# Patient Record
Sex: Male | Born: 1960 | Race: White | Hispanic: No | Smoking: Current every day smoker
Health system: Southern US, Community
[De-identification: ages and names within clinical notes are randomized; demographics above are authoritative.]

## PROBLEM LIST (undated history)

## (undated) DIAGNOSIS — F1020 Alcohol dependence, uncomplicated: Secondary | ICD-10-CM

## (undated) DIAGNOSIS — G43909 Migraine, unspecified, not intractable, without status migrainosus: Secondary | ICD-10-CM

## (undated) DIAGNOSIS — F32A Depression, unspecified: Secondary | ICD-10-CM

## (undated) DIAGNOSIS — F329 Major depressive disorder, single episode, unspecified: Secondary | ICD-10-CM

## (undated) DIAGNOSIS — M549 Dorsalgia, unspecified: Secondary | ICD-10-CM

## (undated) DIAGNOSIS — F1910 Other psychoactive substance abuse, uncomplicated: Secondary | ICD-10-CM

## (undated) DIAGNOSIS — F419 Anxiety disorder, unspecified: Secondary | ICD-10-CM

## (undated) DIAGNOSIS — G8929 Other chronic pain: Secondary | ICD-10-CM

## (undated) DIAGNOSIS — M1990 Unspecified osteoarthritis, unspecified site: Secondary | ICD-10-CM

## (undated) HISTORY — PX: NASAL SEPTUM SURGERY: SHX37

## (undated) HISTORY — PX: FRACTURE SURGERY: SHX138

## (undated) HISTORY — DX: Major depressive disorder, single episode, unspecified: F32.9

## (undated) HISTORY — DX: Alcohol dependence, uncomplicated: F10.20

## (undated) HISTORY — PX: FEMUR FRACTURE SURGERY: SHX633

## (undated) HISTORY — DX: Other psychoactive substance abuse, uncomplicated: F19.10

## (undated) HISTORY — DX: Anxiety disorder, unspecified: F41.9

## (undated) HISTORY — DX: Depression, unspecified: F32.A

---

## 2003-01-05 ENCOUNTER — Emergency Department (HOSPITAL_COMMUNITY): Admission: EM | Admit: 2003-01-05 | Discharge: 2003-01-05 | Payer: Self-pay | Admitting: Emergency Medicine

## 2003-11-21 ENCOUNTER — Emergency Department (HOSPITAL_COMMUNITY): Admission: EM | Admit: 2003-11-21 | Discharge: 2003-11-21 | Payer: Self-pay | Admitting: Emergency Medicine

## 2004-05-15 ENCOUNTER — Emergency Department (HOSPITAL_COMMUNITY): Admission: EM | Admit: 2004-05-15 | Discharge: 2004-05-15 | Payer: Self-pay | Admitting: Emergency Medicine

## 2004-05-20 ENCOUNTER — Emergency Department (HOSPITAL_COMMUNITY): Admission: EM | Admit: 2004-05-20 | Discharge: 2004-05-20 | Payer: Self-pay | Admitting: Emergency Medicine

## 2004-07-16 ENCOUNTER — Emergency Department (HOSPITAL_COMMUNITY): Admission: EM | Admit: 2004-07-16 | Discharge: 2004-07-16 | Payer: Self-pay | Admitting: Family Medicine

## 2004-12-10 ENCOUNTER — Emergency Department (HOSPITAL_COMMUNITY): Admission: EM | Admit: 2004-12-10 | Discharge: 2004-12-10 | Payer: Self-pay | Admitting: Emergency Medicine

## 2005-02-17 ENCOUNTER — Emergency Department (HOSPITAL_COMMUNITY): Admission: EM | Admit: 2005-02-17 | Discharge: 2005-02-17 | Payer: Self-pay | Admitting: Emergency Medicine

## 2005-12-26 ENCOUNTER — Emergency Department (HOSPITAL_COMMUNITY): Admission: EM | Admit: 2005-12-26 | Discharge: 2005-12-26 | Payer: Self-pay | Admitting: Certified Registered"

## 2006-01-02 ENCOUNTER — Emergency Department (HOSPITAL_COMMUNITY): Admission: EM | Admit: 2006-01-02 | Discharge: 2006-01-02 | Payer: Self-pay | Admitting: Emergency Medicine

## 2008-02-29 ENCOUNTER — Emergency Department (HOSPITAL_COMMUNITY): Admission: EM | Admit: 2008-02-29 | Discharge: 2008-02-29 | Payer: Self-pay | Admitting: Emergency Medicine

## 2008-02-29 ENCOUNTER — Encounter (INDEPENDENT_AMBULATORY_CARE_PROVIDER_SITE_OTHER): Payer: Self-pay | Admitting: Family Medicine

## 2008-04-24 ENCOUNTER — Emergency Department (HOSPITAL_COMMUNITY): Admission: EM | Admit: 2008-04-24 | Discharge: 2008-04-24 | Payer: Self-pay | Admitting: Emergency Medicine

## 2008-05-11 ENCOUNTER — Emergency Department (HOSPITAL_COMMUNITY): Admission: EM | Admit: 2008-05-11 | Discharge: 2008-05-11 | Payer: Self-pay | Admitting: Emergency Medicine

## 2008-06-23 ENCOUNTER — Ambulatory Visit: Payer: Self-pay | Admitting: Family Medicine

## 2008-06-23 ENCOUNTER — Telehealth (INDEPENDENT_AMBULATORY_CARE_PROVIDER_SITE_OTHER): Payer: Self-pay | Admitting: Family Medicine

## 2008-06-30 ENCOUNTER — Encounter (INDEPENDENT_AMBULATORY_CARE_PROVIDER_SITE_OTHER): Payer: Self-pay | Admitting: Family Medicine

## 2008-07-05 DIAGNOSIS — M549 Dorsalgia, unspecified: Secondary | ICD-10-CM | POA: Insufficient documentation

## 2008-07-10 ENCOUNTER — Telehealth (INDEPENDENT_AMBULATORY_CARE_PROVIDER_SITE_OTHER): Payer: Self-pay | Admitting: *Deleted

## 2008-07-14 ENCOUNTER — Encounter (INDEPENDENT_AMBULATORY_CARE_PROVIDER_SITE_OTHER): Payer: Self-pay | Admitting: Family Medicine

## 2008-07-20 ENCOUNTER — Ambulatory Visit: Payer: Self-pay | Admitting: Family Medicine

## 2008-07-20 LAB — CONVERTED CEMR LAB
ALT: 30 units/L (ref 0–53)
AST: 24 units/L (ref 0–37)
Alkaline Phosphatase: 72 units/L (ref 39–117)
Basophils Relative: 1 % (ref 0–1)
CO2: 24 meq/L (ref 19–32)
Calcium: 9.6 mg/dL (ref 8.4–10.5)
Chloride: 107 meq/L (ref 96–112)
Creatinine, Ser: 0.87 mg/dL (ref 0.40–1.50)
Eosinophils Absolute: 0.1 10*3/uL (ref 0.0–0.7)
Eosinophils Relative: 1 % (ref 0–5)
HDL: 48 mg/dL (ref >39)
Lymphs Abs: 1.9 10*3/uL (ref 0.7–4.0)
Neutro Abs: 5.3 10*3/uL (ref 1.7–7.7)
Platelets: 326 10*3/uL (ref 150–400)
Potassium: 4.7 meq/L (ref 3.5–5.3)
Sodium: 144 meq/L (ref 135–145)
TSH: 0.847 microintl units/mL (ref 0.350–4.50)
Total Protein: 7.3 g/dL (ref 6.0–8.3)
VLDL: 24 mg/dL (ref 0–40)

## 2008-07-21 ENCOUNTER — Encounter (INDEPENDENT_AMBULATORY_CARE_PROVIDER_SITE_OTHER): Payer: Self-pay | Admitting: Family Medicine

## 2008-07-22 ENCOUNTER — Telehealth (INDEPENDENT_AMBULATORY_CARE_PROVIDER_SITE_OTHER): Payer: Self-pay | Admitting: *Deleted

## 2008-07-30 ENCOUNTER — Encounter (INDEPENDENT_AMBULATORY_CARE_PROVIDER_SITE_OTHER): Payer: Self-pay | Admitting: Family Medicine

## 2008-08-11 ENCOUNTER — Encounter: Admission: RE | Admit: 2008-08-11 | Discharge: 2008-08-11 | Payer: Self-pay | Admitting: Family Medicine

## 2008-08-18 ENCOUNTER — Encounter (INDEPENDENT_AMBULATORY_CARE_PROVIDER_SITE_OTHER): Payer: Self-pay | Admitting: Family Medicine

## 2008-08-21 ENCOUNTER — Telehealth (INDEPENDENT_AMBULATORY_CARE_PROVIDER_SITE_OTHER): Payer: Self-pay | Admitting: Family Medicine

## 2008-08-24 ENCOUNTER — Telehealth (INDEPENDENT_AMBULATORY_CARE_PROVIDER_SITE_OTHER): Payer: Self-pay | Admitting: *Deleted

## 2008-08-31 ENCOUNTER — Telehealth (INDEPENDENT_AMBULATORY_CARE_PROVIDER_SITE_OTHER): Payer: Self-pay | Admitting: *Deleted

## 2008-09-07 ENCOUNTER — Telehealth (INDEPENDENT_AMBULATORY_CARE_PROVIDER_SITE_OTHER): Payer: Self-pay | Admitting: *Deleted

## 2008-09-07 ENCOUNTER — Emergency Department (HOSPITAL_COMMUNITY): Admission: EM | Admit: 2008-09-07 | Discharge: 2008-09-07 | Payer: Self-pay | Admitting: Emergency Medicine

## 2008-09-08 ENCOUNTER — Ambulatory Visit: Payer: Self-pay | Admitting: Internal Medicine

## 2008-09-09 ENCOUNTER — Ambulatory Visit: Payer: Self-pay | Admitting: *Deleted

## 2008-09-15 ENCOUNTER — Encounter (INDEPENDENT_AMBULATORY_CARE_PROVIDER_SITE_OTHER): Payer: Self-pay | Admitting: Internal Medicine

## 2009-09-02 ENCOUNTER — Ambulatory Visit: Payer: Self-pay | Admitting: Internal Medicine

## 2009-09-02 DIAGNOSIS — M5430 Sciatica, unspecified side: Secondary | ICD-10-CM

## 2009-09-07 ENCOUNTER — Encounter (INDEPENDENT_AMBULATORY_CARE_PROVIDER_SITE_OTHER): Payer: Self-pay | Admitting: Internal Medicine

## 2010-05-01 ENCOUNTER — Emergency Department (HOSPITAL_COMMUNITY)
Admission: EM | Admit: 2010-05-01 | Discharge: 2010-05-01 | Payer: Self-pay | Source: Home / Self Care | Admitting: Emergency Medicine

## 2010-07-12 NOTE — Letter (Signed)
Summary: REQUESTING RECORDS FROM [facility]  REQUESTING RECORDS FROM [facility]   Imported By: Arta Bruce 09/21/2009 15:57:58  _____________________________________________________________________  External Attachment:    Type:   Image     Comment:   External Document

## 2010-07-12 NOTE — Assessment & Plan Note (Signed)
Summary: ref to neurology//kt   Vital Signs:  Patient profile:   50 year old male Weight:      155.25 pounds BMI:     25.15 Temp:     98.2 degrees F Pulse rate:   65 / minute Pulse rhythm:   regular Resp:     16 per minute BP sitting:   133 / 86  (left arm) Cuff size:   regular  Vitals Entered By: Chauncy Passy, SMA  CC: Pt. is here b/c his both hand and right foot start to go numb. He was told at [facility] hosp. ER that his spinal cord is being constricted. Pt. would like to be referred out to a neuro. Pt. is taking goody powder for the pain. Any pysical activity makes the pain worse. Is Patient Diabetic? No Pain Assessment Patient in pain? yes     Location: neck/back Intensity: 7 Type: dull Onset of pain  Constant  Does patient need assistance? Functional Status Self care Ambulation Normal   CC:  Pt. is here b/c his both hand and right foot start to go numb. He was told at [facility] hosp. ER that his spinal cord is being constricted. Pt. would like to be referred out to a neuro. Pt. is taking goody powder for the pain. Any pysical activity makes the pain worse.Marland Kitchen  History of Present Illness: 1.  Chronic back and neck pain:  Going to [facility]--apparently prescribing Lidoderm patches and Percocet.  Stopped all pain meds as unemployment ran out.  Last seen 2 months ago.  Was getting Lidoderm patches through [facility]--later states was Fentanyl patches.  2. Having numbness in hands for past 4 months.  Can just bend his neck and they go completely numb.  Does not really awaken with hand asleep when in bed.  Started out with just ulnar hands going numb, now the entire hand goes numb.  Pt. 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  3.  Right foot going numb with sitting.  Also in past 4 months.  Whole foot goes numb when sitting.  Resolves when gets up and moves  around.    Current Medications (verified): 1)  Celebrex 100 Mg Caps (Celecoxib) .... Take 1 Capsule By Mouth Every 12 Hours As Needed For Neck and Back Pain 2)  Carisoprodol 350 Mg Tabs (Carisoprodol) .Marland Kitchen.. 1 Tab By Mouth Q 8 Hours As Needed Muscle Spasm 3)  Neurontin 300 Mg Caps (Gabapentin) .Marland Kitchen.. 1 Cap By Mouth Q Hs.  Increase By 1 Cap Every 3 Days Until Taking 2 Caps By Mouth Three Times A Day  Allergies (verified): No Known Drug Allergies  Physical Exam  Msk:  NT over all spinous processes.   Extremities:  Full ROM Neurologic:  alert & oriented X3, cranial nerves II-XII intact, strength normal in all extremities, sensation intact to light touch, and DTRs symmetrical and normal.   Possible positive Tinel's over bilateral median nerve at volar wrists.  Negative Phalen's   Impression & Recommendations:  Problem # 1:  CERVICAL RADICULOPATHY, BILATERAL (ICD-723.4)  Orders: Physical Therapy Referral (PT)  Problem # 2:  SCIATICA, RIGHT (ICD-724.3)  The following medications were removed from the medication list:    Celebrex 100 Mg Caps (Celecoxib) .Marland Kitchen... Take 1 capsule by mouth every 12 hours as needed for neck and back pain  Carisoprodol 350 Mg Tabs (Carisoprodol) .Marland Kitchen... 1 tab by mouth q 8 hours as needed muscle spasm  Orders: Physical Therapy Referral (PT)  Problem # 3:  BACK PAIN, CHRONIC (ICD-724.5) Discussed no pain meds until get pain clinic records--reportedly receiving Fentanyl patches and will not fill those, regardless. The following medications were removed from the medication list:    Celebrex 100 Mg Caps (Celecoxib) .Marland Kitchen... Take 1 capsule by mouth every 12 hours as needed for neck and back pain    Carisoprodol 350 Mg Tabs (Carisoprodol) .Marland Kitchen... 1 tab by mouth q 8 hours as needed muscle spasm  Patient Instructions: 1)  Release of INformation--[facility]. 2)  Follow up with Dr. Delrae Alfred in 3 months --hand numbness and foot numbness.

## 2010-08-02 ENCOUNTER — Emergency Department (HOSPITAL_COMMUNITY)
Admission: EM | Admit: 2010-08-02 | Discharge: 2010-08-02 | Disposition: A | Discharge status: Home / Self Care | Payer: Medicaid Other | Attending: Emergency Medicine | Admitting: Emergency Medicine

## 2010-08-02 DIAGNOSIS — R197 Diarrhea, unspecified: Secondary | ICD-10-CM | POA: Insufficient documentation

## 2010-08-02 DIAGNOSIS — G8929 Other chronic pain: Secondary | ICD-10-CM | POA: Insufficient documentation

## 2010-08-02 DIAGNOSIS — M542 Cervicalgia: Secondary | ICD-10-CM | POA: Insufficient documentation

## 2010-08-02 DIAGNOSIS — M549 Dorsalgia, unspecified: Secondary | ICD-10-CM | POA: Insufficient documentation
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
## 2011-03-13 LAB — URINE MICROSCOPIC-ADD ON

## 2011-03-13 LAB — URINALYSIS, ROUTINE W REFLEX MICROSCOPIC
Protein, ur: NEGATIVE
Specific Gravity, Urine: 1.018

## 2011-09-29 ENCOUNTER — Encounter (HOSPITAL_COMMUNITY): Payer: Self-pay | Admitting: Physical Medicine and Rehabilitation

## 2011-09-29 ENCOUNTER — Emergency Department (HOSPITAL_COMMUNITY)
Admission: EM | Admit: 2011-09-29 | Discharge: 2011-09-29 | Disposition: A | Discharge status: Home Health | Payer: Medicaid Other | Attending: Emergency Medicine | Admitting: Emergency Medicine

## 2011-09-29 DIAGNOSIS — F17200 Nicotine dependence, unspecified, uncomplicated: Secondary | ICD-10-CM | POA: Insufficient documentation

## 2011-09-29 DIAGNOSIS — G8929 Other chronic pain: Secondary | ICD-10-CM | POA: Insufficient documentation

## 2011-09-29 DIAGNOSIS — M549 Dorsalgia, unspecified: Secondary | ICD-10-CM

## 2011-09-29 HISTORY — DX: Other chronic pain: G89.29

## 2011-09-29 MED ORDER — FENTANYL 50 MCG/HR TD PT72: 1.0000 | MEDICATED_PATCH | TRANSDERMAL | Status: DC

## 2011-09-29 MED ORDER — OXYMORPHONE HCL ER 10 MG PO T12A: 10.0000 mg | EXTENDED_RELEASE_TABLET | Freq: Two times a day (BID) | ORAL | Status: DC

## 2011-09-29 NOTE — ED Notes (Signed)
Pt presents to department for evaluation of medication refill. Pt states he is no longer a patient at pain clinic, he was referred to family practice. C/o chronic neck and back pain. States he is out of fentanyl patches and oxymorphone. Requesting medication refill until next appointment. He is alert and oriented x4. No signs of acute distress noted.

## 2011-09-29 NOTE — ED Provider Notes (Signed)
Medical screening examination/treatment/procedure(s) were performed by non-physician practitioner and as supervising physician I was immediately available for consultation/collaboration.  Colby Reels R. Sandip Power, MD 09/29/11 1543 

## 2011-09-29 NOTE — Discharge Instructions (Signed)
It is VERY important for you to contact either [facility] or [facility] to further discuss your pain management because the emergency department will not refill chronic pain medications on a regular basis and you will run out of your pain medications before your scheduled appointment on May 8th. You will need to plan ahead and not wait until the day the medication runs out. Return to ER for emergent changing or worsening of symptoms.   Chronic Back Pain When back pain lasts longer than 3 months, it is called chronic back pain.This pain can be frustrating, but the cause of the pain is rarely dangerous.People with chronic back pain often go through certain periods that are more intense (flare-ups). CAUSES Chronic back pain can be caused by wear and tear (degeneration) on different structures in your back. These structures may include bones, ligaments, or discs. This degeneration may result in more pressure being placed on the nerves that travel to your legs and feet. This can lead to pain traveling from the low back down the back of the legs. When pain lasts longer than 3 months, it is not unusual for people to experience anxiety or depression. Anxiety and depression can also contribute to low back pain. TREATMENT  Establish a regular exercise plan. This is critical to improving your functional level.   Have a self-management plan for when you flare-up. Flare-ups rarely require a medical visit. Regular exercise will help reduce the intensity and frequency of your flare-ups.   Manage how you feel about your back pain and the rest of your life. Anxiety, depression, and feeling that you cannot alter your back pain have been shown to make back pain more intense and debilitating.   Medicines should never be your only treatment. They should be used along with other treatments to help you return to a more active lifestyle.   Procedures such as injections or surgery may be helpful but  are rarely necessary. You may be able to get the same results with physical therapy or chiropractic care.  HOME CARE INSTRUCTIONS  Avoid bending, heavy lifting, prolonged sitting, and activities which make the problem worse.   Continue normal activity as much as possible.   Take brief periods of rest throughout the day to reduce your pain during flare-ups.   Follow your back exercise rehabilitation program. This can help reduce symptoms and prevent more pain.   Only take over-the-counter or prescription medicines as directed by your caregiver. Muscle relaxants are sometimes prescribed. Narcotic pain medicine is discouraged for long-term pain, since addiction is a possible outcome.   If you smoke, quit.   Eat healthy foods and maintain a recommended body weight.  SEEK IMMEDIATE MEDICAL CARE IF:   You have weakness or numbness in one of your legs or feet.   You have trouble controlling your bladder or bowels.   You develop nausea, vomiting, abdominal pain, shortness of breath, or fainting.  Document Released: 07/06/2004 Document Revised: 05/18/2011 Document Reviewed: 05/13/2011 [facility] Patient Information 2012 [place], [place].

## 2011-09-29 NOTE — ED Provider Notes (Signed)
History     CSN: [id number]  Arrival date & time 09/29/11  7829   First MD Initiated Contact with Patient 09/29/11 1016      Chief Complaint  Patient presents with  . Medication Refill    (Consider location/radiation/quality/duration/timing/severity/associated sxs/prior treatment) HPI  Patient with long standing hx of chronic pain, chronic back pain who has been managed by [facility] for a long time presents to ER stating that he was at his one month visit yesterday to have oxymorphone and fentanyl refilled but that the provider refused to refill until they "could figure out whether or not he had gotten oxycodone from another source." patient denies taking narcotics from additional source but states he has been unhappy with [facility] [place] for a while and has a schedule appointment with [facility] on May the 8th to continue pain management and for general health care but presents to ER requesting pain medication refill. Patient has no other complaints. States daily chronic pain that is unchanging but increases without pain medication use. Denies new injury. Denies aggravating or alleviating factors. Past Medical History  Diagnosis Date  . Chronic pain     History reviewed. No pertinent past surgical history.  History reviewed. No pertinent family history.  History  Substance Use Topics  . Smoking status: Current Everyday Smoker    Types: Cigarettes  . Smokeless tobacco: Not on file  . Alcohol Use: No      Review of Systems  Constitutional: Negative for fever and chills.  HENT: Positive for neck pain.   Respiratory: Negative for chest tightness.   Cardiovascular: Negative for chest pain.  Musculoskeletal: Positive for back pain. Negative for joint swelling.  Skin: Negative for rash.  Neurological: Positive for weakness. Negative for dizziness.    Allergies  Review of patient's allergies indicates no known allergies.  Home Medications   Current Outpatient  Rx  Name Route Sig Dispense Refill  . GOODY HEADACHE PO Oral Take 1 Package by mouth 2 (two) times daily as needed. For headache    . FENTANYL 50 MCG/HR TD PT72 Transdermal Place 1 patch onto the skin every 3 (three) days.    . IBUPROFEN 200 MG PO TABS Oral Take 600 mg by mouth every 8 (eight) hours as needed. For pain.    Marland Kitchen OXYMORPHONE HCL ER 10 MG PO TB12 Oral Take 10 mg by mouth 4 (four) times daily.    . FENTANYL 50 MCG/HR TD PT72 Transdermal Place 1 patch (50 mcg total) onto the skin every 3 (three) days. 5 patch 0  . OXYMORPHONE ER (CRUSH RESIST) 10 MG PO TB12 Oral Take 1 tablet (10 mg total) by mouth every 12 (twelve) hours. 10 tablet 0    BP 124/81  Pulse 79  Temp(Src) 97.7 F (36.5 C) (Oral)  Resp 18  SpO2 99%  Physical Exam  Nursing note and vitals reviewed. Constitutional: He is oriented to person, place, and time. He appears well-developed and well-nourished. No distress.  HENT:  Head: Normocephalic and atraumatic.  Eyes: Conjunctivae are normal.  Neck: Normal range of motion. Neck supple.  Cardiovascular: Normal rate, regular rhythm, normal heart sounds and intact distal pulses.  Exam reveals no gallop and no friction rub.   No murmur heard. Pulmonary/Chest: Effort normal and breath sounds normal. No respiratory distress. He has no wheezes. He has no rales. He exhibits no tenderness.  Abdominal: Soft. Bowel sounds are normal. He exhibits no distension and no mass. There is no tenderness.  There is no rebound and no guarding.  Musculoskeletal: Normal range of motion. He exhibits tenderness. He exhibits no edema.       5/5 strength of UE and LE bilaterally. Normal reflexes. TTP of entire midline spine but FROM of neck. No skin changes. Ambulating without difficulty.   Neurological: He is alert and oriented to person, place, and time. He has normal reflexes.  Skin: Skin is warm and dry. No rash noted. He is not diaphoretic. No erythema.  Psychiatric: He has a normal mood and  affect.    ED Course  Procedures (including critical care time)  Labs Reviewed - No data to display No results found.   1. BACK PAIN, CHRONIC       MDM  I reviewed narcotic database to see a long standing hx of pain medication prescription by one provider at [facility] on a monthly basis with last prescriptions given a month ago with none recorded since. I spoke at length with patient about the inability and inappropriateness of ER to fill chronic pain meds and that could not given him his normal month supply but that he would be prescribed a few days to allow him to further discuss pain management with [facility] or get an earlier appointment with [facility].   Patient has no new complaints other than daily chronic pain. No new injury or red flags noted for back pain complaints.         Jenness Corner, [place] 09/29/11 1056

## 2011-10-06 ENCOUNTER — Emergency Department (HOSPITAL_COMMUNITY)
Admission: EM | Admit: 2011-10-06 | Discharge: 2011-10-06 | Disposition: A | Discharge status: Home / Self Care | Payer: Medicaid Other | Attending: Emergency Medicine | Admitting: Emergency Medicine

## 2011-10-06 ENCOUNTER — Encounter (HOSPITAL_COMMUNITY): Payer: Self-pay | Admitting: Emergency Medicine

## 2011-10-06 DIAGNOSIS — M549 Dorsalgia, unspecified: Secondary | ICD-10-CM | POA: Insufficient documentation

## 2011-10-06 DIAGNOSIS — F17200 Nicotine dependence, unspecified, uncomplicated: Secondary | ICD-10-CM | POA: Insufficient documentation

## 2011-10-06 DIAGNOSIS — G8929 Other chronic pain: Secondary | ICD-10-CM | POA: Insufficient documentation

## 2011-10-06 MED ORDER — OXYCODONE-ACETAMINOPHEN 5-325 MG PO TABS
2.0000 | ORAL_TABLET | Freq: Once | ORAL | Status: DC
  Filled 2011-10-06: qty 2

## 2011-10-06 NOTE — ED Notes (Signed)
Pt c/o of chronic pain in back and neck. Pt was being seen at a pain clinic but quit going.

## 2011-10-06 NOTE — ED Notes (Signed)
Pt encouraged to make appointment with pain clinic.

## 2011-10-06 NOTE — ED Provider Notes (Signed)
Medical screening examination/treatment/procedure(s) were performed by non-physician practitioner and as supervising physician I was immediately available for consultation/collaboration.   Caison Hearn M Ibraham Levi, DO 10/06/11 2218 

## 2011-10-06 NOTE — Discharge Instructions (Signed)
It is EXTREMELY important to get in with your pain management clinic and or your primary care provider for further evaluation and management of her chronic back pain. Chronic pain management must be taken care of with this view providers as possible using the Emergency Department for emergent changing or worsening of symptoms.  Chronic Back Pain When back pain lasts longer than 3 months, it is called chronic back pain.This pain can be frustrating, but the cause of the pain is rarely dangerous.People with chronic back pain often go through certain periods that are more intense (flare-ups). CAUSES Chronic back pain can be caused by wear and tear (degeneration) on different structures in your back. These structures may include bones, ligaments, or discs. This degeneration may result in more pressure being placed on the nerves that travel to your legs and feet. This can lead to pain traveling from the low back down the back of the legs. When pain lasts longer than 3 months, it is not unusual for people to experience anxiety or depression. Anxiety and depression can also contribute to low back pain. TREATMENT  Establish a regular exercise plan. This is critical to improving your functional level.   Have a self-management plan for when you flare-up. Flare-ups rarely require a medical visit. Regular exercise will help reduce the intensity and frequency of your flare-ups.   Manage how you feel about your back pain and the rest of your life. Anxiety, depression, and feeling that you cannot alter your back pain have been shown to make back pain more intense and debilitating.   Medicines should never be your only treatment. They should be used along with other treatments to help you return to a more active lifestyle.   Procedures such as injections or surgery may be helpful but are rarely necessary. You may be able to get the same results with physical therapy or chiropractic care.  HOME CARE  INSTRUCTIONS  Avoid bending, heavy lifting, prolonged sitting, and activities which make the problem worse.   Continue normal activity as much as possible.   Take brief periods of rest throughout the day to reduce your pain during flare-ups.   Follow your back exercise rehabilitation program. This can help reduce symptoms and prevent more pain.   Only take over-the-counter or prescription medicines as directed by your caregiver. Muscle relaxants are sometimes prescribed. Narcotic pain medicine is discouraged for long-term pain, since addiction is a possible outcome.   If you smoke, quit.   Eat healthy foods and maintain a recommended body weight.  SEEK IMMEDIATE MEDICAL CARE IF:   You have weakness or numbness in one of your legs or feet.   You have trouble controlling your bladder or bowels.   You develop nausea, vomiting, abdominal pain, shortness of breath, or fainting.  Document Released: 07/06/2004 Document Revised: 05/18/2011 Document Reviewed: 05/13/2011 [facility] Patient Information 2012 [place], [place].

## 2011-10-06 NOTE — ED Provider Notes (Signed)
History     CSN: [id number]  Arrival date & time 10/06/11  1010   First MD Initiated Contact with Patient 10/06/11 1015      No chief complaint on file.   (Consider location/radiation/quality/duration/timing/severity/associated sxs/prior treatment) HPI  Patient presents to emergency department with complaint of chronic lower back pain with request of pain medication refill. I saw this patient in the [facility] Emergency Department last week and spoke at length with patient about chronic pain management. At that time patient was hoping to followup with [facility] for further management of his chronic pain because he did not want to continue use with the [facility] Pain management clinic. Patient's plan last week was to closely follow up with [facility]. Last week he was given a short supply of his opana and fentanyl patches. However patient states he has not followed up with [facility] or [facility] since leaving ER last week. He presents today stating that he has run out of his pain medicine he would like a refill. Patient denies any new or changing pain. Patient states his lower back pain is consistent with a chronic pain he's had for many years. He specifically denies lower [place] he numbness/tingling/weakness, saddle seat paresthesias, or loss of bowel or bladder function. He denies aggravating or alleviating factors. Past Medical History  Diagnosis Date  . Chronic pain     History reviewed. No pertinent past surgical history.  No family history on file.  History  Substance Use Topics  . Smoking status: Current Everyday Smoker    Types: Cigarettes  . Smokeless tobacco: Not on file  . Alcohol Use: No      Review of Systems  All other systems reviewed and are negative.    Allergies  Review of patient's allergies indicates no known allergies.  Home Medications   Current Outpatient Rx  Name Route Sig Dispense Refill  . GOODY HEADACHE PO Oral Take 1 Package by mouth 2 (two) times  daily as needed. For headache    . FENTANYL 50 MCG/HR TD PT72 Transdermal Place 1 patch onto the skin every 3 (three) days.    . FENTANYL 50 MCG/HR TD PT72 Transdermal Place 1 patch (50 mcg total) onto the skin every 3 (three) days. 5 patch 0  . IBUPROFEN 200 MG PO TABS Oral Take 600 mg by mouth every 8 (eight) hours as needed. For pain.    Marland Kitchen OXYMORPHONE HCL ER 10 MG PO TB12 Oral Take 10 mg by mouth 4 (four) times daily.    Marland Kitchen OXYMORPHONE ER (CRUSH RESIST) 10 MG PO TB12 Oral Take 1 tablet (10 mg total) by mouth every 12 (twelve) hours. 10 tablet 0    BP 148/81  Pulse 95  Temp 98.3 F (36.8 C)  Resp 20  SpO2 98%  Physical Exam  Nursing note and vitals reviewed. Constitutional: He is oriented to person, place, and time. He appears well-developed and well-nourished. No distress.  HENT:  Head: Normocephalic and atraumatic.  Eyes: Conjunctivae are normal.  Neck: Normal range of motion. Neck supple.  Cardiovascular: Normal rate, regular rhythm, normal heart sounds and intact distal pulses.  Exam reveals no gallop and no friction rub.   No murmur heard. Pulmonary/Chest: Effort normal and breath sounds normal. No respiratory distress. He has no wheezes. He has no rales. He exhibits no tenderness.  Abdominal: Soft. Bowel sounds are normal. He exhibits no distension and no mass. There is no tenderness. There is no rebound and no guarding.  Musculoskeletal:  Normal range of motion. He exhibits tenderness. He exhibits no edema.       Tenderness to palpation of entire mid to lower back and paraspinal region. No crepitus or skin changes.  Neurological: He is alert and oriented to person, place, and time. He has normal reflexes.       Negative straight leg. DTRs equal bilaterally of upper and lower x-rays.  Skin: Skin is warm and dry. No rash noted. He is not diaphoretic. No erythema.  Psychiatric: He has a normal mood and affect.    ED Course  Procedures (including critical care time)  Labs  Reviewed - No data to display No results found.   1. BACK PAIN, CHRONIC       MDM  Patient with chronic back pain with no red flags to suggest acute underlying disorders. Ambulating without difficulty. Bilateral extremities are neurovascularly intact.        Jenness Corner, [place] 10/06/11 1034

## 2011-10-12 ENCOUNTER — Encounter (HOSPITAL_COMMUNITY): Payer: Self-pay | Admitting: Emergency Medicine

## 2011-10-12 ENCOUNTER — Emergency Department (HOSPITAL_COMMUNITY)
Admission: EM | Admit: 2011-10-12 | Discharge: 2011-10-12 | Disposition: A | Discharge status: Home / Self Care | Payer: Medicaid Other | Attending: Emergency Medicine | Admitting: Emergency Medicine

## 2011-10-12 DIAGNOSIS — R197 Diarrhea, unspecified: Secondary | ICD-10-CM | POA: Insufficient documentation

## 2011-10-12 DIAGNOSIS — R1110 Vomiting, unspecified: Secondary | ICD-10-CM | POA: Insufficient documentation

## 2011-10-12 DIAGNOSIS — R059 Cough, unspecified: Secondary | ICD-10-CM | POA: Insufficient documentation

## 2011-10-12 DIAGNOSIS — G8929 Other chronic pain: Secondary | ICD-10-CM | POA: Insufficient documentation

## 2011-10-12 DIAGNOSIS — R05 Cough: Secondary | ICD-10-CM | POA: Insufficient documentation

## 2011-10-12 DIAGNOSIS — M549 Dorsalgia, unspecified: Secondary | ICD-10-CM | POA: Insufficient documentation

## 2011-10-12 LAB — URINALYSIS, ROUTINE W REFLEX MICROSCOPIC
Bilirubin Urine: NEGATIVE
Ketones, ur: NEGATIVE mg/dL
Leukocytes, UA: NEGATIVE
Nitrite: NEGATIVE
Urobilinogen, UA: 0.2 mg/dL (ref 0.0–1.0)

## 2011-10-12 LAB — URINE MICROSCOPIC-ADD ON

## 2011-10-12 LAB — CBC
MCHC: 35.3 g/dL (ref 30.0–36.0)
Platelets: 331 10*3/uL (ref 150–400)
RBC: 5.16 MIL/uL (ref 4.22–5.81)
WBC: 13.7 10*3/uL — ABNORMAL HIGH (ref 4.0–10.5)

## 2011-10-12 LAB — COMPREHENSIVE METABOLIC PANEL
ALT: 14 U/L (ref 0–53)
Albumin: 4.1 g/dL (ref 3.5–5.2)
CO2: 24 mEq/L (ref 19–32)
Calcium: 9.1 mg/dL (ref 8.4–10.5)
Chloride: 105 mEq/L (ref 96–112)
GFR calc Af Amer: >90 mL/min (ref >90)
Glucose, Bld: 111 mg/dL — ABNORMAL HIGH (ref 70–99)
Potassium: 4 mEq/L (ref 3.5–5.1)
Sodium: 142 mEq/L (ref 135–145)
Total Bilirubin: 0.1 mg/dL — ABNORMAL LOW (ref 0.3–1.2)

## 2011-10-12 LAB — DIFFERENTIAL
Basophils Absolute: 0 10*3/uL (ref 0.0–0.1)
Basophils Relative: 0 % (ref 0–1)
Eosinophils Absolute: 0.1 10*3/uL (ref 0.0–0.7)
Eosinophils Relative: 1 % (ref 0–5)
Lymphocytes Relative: 17 % (ref 12–46)
Lymphs Abs: 2.3 10*3/uL (ref 0.7–4.0)
Monocytes Absolute: 0.6 10*3/uL (ref 0.1–1.0)

## 2011-10-12 LAB — RAPID URINE DRUG SCREEN, HOSP PERFORMED
Amphetamines: NOT DETECTED
Cocaine: POSITIVE — AB
Opiates: POSITIVE — AB

## 2011-10-12 MED ORDER — OXYMORPHONE HCL 10 MG PO TABS: 10.0000 mg | ORAL_TABLET | ORAL | Status: AC | PRN

## 2011-10-12 MED ORDER — FENTANYL CITRATE 0.05 MG/ML IJ SOLN
100.0000 ug | Freq: Once | INTRAMUSCULAR | Status: AC
  Administered 2011-10-12: 100 ug via INTRAMUSCULAR
  Filled 2011-10-12: qty 2

## 2011-10-12 NOTE — ED Notes (Signed)
Dinner tray ordered, reg nonsharp 

## 2011-10-12 NOTE — ED Notes (Signed)
Pt reports that he has been on fentanyl patches for chronic pain. States that he sees a pain clinic but has not recently been able to get back in recently. States that he needs to have some type of pain medication and feels like he may be having withdrawals because he needs pain medication. Pt calm and cooperative, denies any SI/HI at this time.

## 2011-10-12 NOTE — ED Notes (Signed)
Going thru w/drawal from fenty;e patches last used 4 days ago deies SI or HI

## 2011-10-12 NOTE — ED Provider Notes (Addendum)
History     CSN: [id number]  Arrival date & time 10/12/11  1119   First MD Initiated Contact with Patient 10/12/11 1237      Chief Complaint  Patient presents with  . Medical Clearance    (Consider location/radiation/quality/duration/timing/severity/associated sxs/prior treatment) HPI  51yoM h/o chronic back pain since with back pain. The patient states that he ran out of his fentanyl patches approximately 4 days ago. He feels like he is withdrawing from his narcotic medication because he also ran out of his oxygen more for him. He states he's had a couple episodes of diarrhea as well as 2 episodes of vomiting. He states his pain has been identical to his previous pain but worse after running out of his narcotic pain medications. He denies numbness, tingling, weakness of his extremities. He denies saddle anesthesia. There is no urinary retention or incontinence. He denies fevers, chills. No recent trauma. 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 Is not requesting help for his narcotic dependence rather he is requesting a narcotic prescription until "I can get into my doctor in one week". To the emergency department multiple times for medication refills. He's also been seen in the past and pain clinic and states that he will not return to that same pain clinic.  He states that he was seen several weeks ago for a cough and prescribed antibiotics. He states his cough is better and he has not had a fever since 3 weeks ago. No shortness of breath or chest pain.  Denies SI/HI/AVH   ED Notes, ED Provider Notes from 10/12/11 0000 to 10/12/11 11:31:05       Clare Charon, RN 10/12/2011 11:26      Going thru w/drawal from fenty;e patches last used 4 days ago deies SI or HI     Past Medical History  Diagnosis Date  . Chronic pain     History reviewed. No pertinent past surgical history.  No family history on file.  History  Substance Use Topics  . Smoking status: Current Everyday  Smoker    Types: Cigarettes  . Smokeless tobacco: Not on file  . Alcohol Use: No      Review of Systems  All other systems reviewed and are negative.   except as noted HPI   Allergies  Review of patient's allergies indicates no known allergies.  Home Medications   Current Outpatient Rx  Name Route Sig Dispense Refill  . GOODY HEADACHE PO Oral Take 1 Package by mouth 2 (two) times daily as needed. For headache    . FENTANYL 50 MCG/HR TD PT72 Transdermal Place 1 patch onto the skin every 3 (three) days.    Marland Kitchen OXYMORPHONE HCL ER 10 MG PO TB12 Oral Take 10 mg by mouth 4 (four) times daily.    Marland Kitchen OXYMORPHONE HCL 10 MG PO TABS Oral Take 1 tablet (10 mg total) by mouth every 4 (four) hours as needed for pain. 20 tablet 0    BP 132/96  Pulse 91  Temp(Src) 98.4 F (36.9 C) (Oral)  Resp 18  SpO2 97%  Physical Exam  Nursing note and vitals reviewed. Constitutional: He is oriented to person, place, and time. He appears well-developed and well-nourished. No distress.  HENT:  Head: Atraumatic.  Mouth/Throat: Oropharynx is clear and moist.  Eyes: Conjunctivae are normal. Pupils are equal, round, and reactive to light.  Neck: Normal range of motion. Neck supple.  Cardiovascular: Normal rate, regular rhythm, normal heart sounds and intact  distal pulses.  Exam reveals no gallop and no friction rub.   No murmur heard. Pulmonary/Chest: Effort normal. No respiratory distress. He has no wheezes. He has no rales. He exhibits no tenderness.  Abdominal: Soft. Bowel sounds are normal. There is no tenderness. There is no rebound and no guarding.  Musculoskeletal: Normal range of motion. He exhibits no edema and no tenderness.       Min diffuse thoracic ttp including midline  Neurological: He is alert and oriented to person, place, and time. No cranial nerve deficit. He exhibits normal muscle tone. Coordination normal.       Strength 5/5 all extremities No saddle anesthesia   Skin: Skin is  warm and dry.  Psychiatric: He has a normal mood and affect.    ED Course  Procedures (including critical care time)  Labs Reviewed  COMPREHENSIVE METABOLIC PANEL - Abnormal; Notable for the following:    Glucose, Bld 111 (*)    Total Bilirubin 0.1 (*)    GFR calc non Af Amer 83 (*)    All other components within normal limits  CBC - Abnormal; Notable for the following:    WBC 13.7 (*)    All other components within normal limits  DIFFERENTIAL - Abnormal; Notable for the following:    Neutro Abs 10.6 (*)    All other components within normal limits  URINE RAPID DRUG SCREEN (HOSP PERFORMED) - Abnormal; Notable for the following:    Opiates POSITIVE (*)    Cocaine POSITIVE (*)    Benzodiazepines POSITIVE (*)    All other components within normal limits  URINALYSIS, ROUTINE W REFLEX MICROSCOPIC - Abnormal; Notable for the following:    Hgb urine dipstick SMALL (*)    All other components within normal limits  ETHANOL  URINE MICROSCOPIC-ADD ON   No results found.   1. Chronic pain     MDM  H/o chronic back pain pw apical pain after running out of his narcotic medications. He thinks he is withdrawing with minimal diarrhea and vomiting. There's been no diarrhea vomiting in the emergency department. He is tolerating by mouth. He has no focal findings on exam and is neurovascularly intact. He is afebrile. He does have a slightly elevated white blood cell count of 13.7. UA is unremarkable and given lack of cough, shortness of breath, abnormal exam findings I do not feel that he needs a chest x-ray today. I have a very low suspicion that he has an epidural abscess. The patient is insistent that he be discharged. We'll discharge him home with a few [place] phone tablets until he can followup. He is also encouraged to follow up in pain clinic where he has been seen previously. He has been given strict precautions for return to the emergency department including fevers, worsening or different  back pain.        Forbes Cellar, MD 10/12/11 1530  Forbes Cellar, MD 10/12/11 1530

## 2011-10-12 NOTE — Discharge Instructions (Signed)
Chronic Back Pain When back pain lasts longer than 3 months, it is called chronic back pain.This pain can be frustrating, but the cause of the pain is rarely dangerous.People with chronic back pain often go through certain periods that are more intense (flare-ups). CAUSES Chronic back pain can be caused by wear and tear (degeneration) on different structures in your back. These structures may include bones, ligaments, or discs. This degeneration may result in more pressure being placed on the nerves that travel to your legs and feet. This can lead to pain traveling from the low back down the back of the legs. When pain lasts longer than 3 months, it is not unusual for people to experience anxiety or depression. Anxiety and depression can also contribute to low back pain. TREATMENT  Establish a regular exercise plan. This is critical to improving your functional level.   Have a self-management plan for when you flare-up. Flare-ups rarely require a medical visit. Regular exercise will help reduce the intensity and frequency of your flare-ups.   Manage how you feel about your back pain and the rest of your life. Anxiety, depression, and feeling that you cannot alter your back pain have been shown to make back pain more intense and debilitating.   Medicines should never be your only treatment. They should be used along with other treatments to help you return to a more active lifestyle.   Procedures such as injections or surgery may be helpful but are rarely necessary. You may be able to get the same results with physical therapy or chiropractic care.  HOME CARE INSTRUCTIONS  Avoid bending, heavy lifting, prolonged sitting, and activities which make the problem worse.   Continue normal activity as much as possible.   Take brief periods of rest throughout the day to reduce your pain during flare-ups.   Follow your back exercise rehabilitation program. This can help reduce symptoms and prevent  more pain.   Only take over-the-counter or prescription medicines as directed by your caregiver. Muscle relaxants are sometimes prescribed. Narcotic pain medicine is discouraged for long-term pain, since addiction is a possible outcome.   If you smoke, quit.   Eat healthy foods and maintain a recommended body weight.  SEEK IMMEDIATE MEDICAL CARE IF:   You have weakness or numbness in one of your legs or feet.   You have trouble controlling your bladder or bowels.   You develop nausea, vomiting, abdominal pain, shortness of breath, or fainting.  Document Released: 07/06/2004 Document Revised: 05/18/2011 Document Reviewed: 05/13/2011 [facility] Patient Information 2012 [place], [place].  RESOURCE GUIDE  Dental Problems  Patients with Medicaid: [facility] [phone]                                           [phone] [address] Phone:  [phone]                                                   Phone:  [phone]  If unable to pay or uninsured, contact:  [facility] or [facility]. to become qualified for  the adult dental clinic.  Chronic Pain Problems Contact [facility]  [phone] Patients need to be referred by their primary care doctor.  Insufficient Money for Medicine Contact [employer]:  call "211" or [facility] [phone].  No Primary Care Doctor Call [facility]  [phone] Other agencies that provide inexpensive medical care    [facility]  [phone]    [facility]  [phone]    [facility]  [phone]    [facility]  [phone]    [facility]  5638686136    [facility]  [phone]  Psychological Services [facility]  [phone] [facility]  [phone] [facility] Mental Health   [phone] (emergency services [phone])  Abuse/Neglect [facility] Child Abuse Hotline 267-472-6042 [facility] Child Abuse Hotline [phone] (After Hours)  Emergency Shelter [facility] Ministries [phone]  [facility] [phone] [facility] [phone]  MRSA Hotline #:   3657122123    [facility] Resources  [facility]  [employer]                           [facility] Dept. [address] [place]                     [address]         [address]  Blondell Reveal Phone:  [phone]                                  Phone:  [phone]                   Phone:  [phone]  [facility] Phone:  [phone]  [facility] Child Abuse Hotline [phone] [phone] (After Hours)

## 2011-10-15 ENCOUNTER — Emergency Department (HOSPITAL_COMMUNITY)
Admission: EM | Admit: 2011-10-15 | Discharge: 2011-10-15 | Disposition: A | Discharge status: Home / Self Care | Payer: Medicaid Other | Attending: Emergency Medicine | Admitting: Emergency Medicine

## 2011-10-15 DIAGNOSIS — G8929 Other chronic pain: Secondary | ICD-10-CM | POA: Insufficient documentation

## 2011-10-15 DIAGNOSIS — R51 Headache: Secondary | ICD-10-CM | POA: Insufficient documentation

## 2011-10-15 DIAGNOSIS — F17200 Nicotine dependence, unspecified, uncomplicated: Secondary | ICD-10-CM | POA: Insufficient documentation

## 2011-10-15 MED ORDER — METOCLOPRAMIDE HCL 5 MG/ML IJ SOLN
10.0000 mg | Freq: Once | INTRAMUSCULAR | Status: AC
  Administered 2011-10-15: 10 mg via INTRAVENOUS
  Filled 2011-10-15: qty 2

## 2011-10-15 MED ORDER — DIPHENHYDRAMINE HCL 50 MG/ML IJ SOLN
25.0000 mg | Freq: Once | INTRAMUSCULAR | Status: AC
  Administered 2011-10-15: 25 mg via INTRAVENOUS
  Filled 2011-10-15: qty 1

## 2011-10-15 MED ORDER — SODIUM CHLORIDE 0.9 % IV BOLUS (SEPSIS)
1000.0000 mL | Freq: Once | INTRAVENOUS | Status: AC
  Administered 2011-10-15: 1000 mL via INTRAVENOUS

## 2011-10-15 MED ORDER — IBUPROFEN 800 MG PO TABS: 800.0000 mg | ORAL_TABLET | Freq: Three times a day (TID) | ORAL | Status: DC

## 2011-10-15 MED ORDER — DEXAMETHASONE SODIUM PHOSPHATE 10 MG/ML IJ SOLN
10.0000 mg | Freq: Once | INTRAMUSCULAR | Status: AC
  Administered 2011-10-15: 10 mg via INTRAVENOUS
  Filled 2011-10-15: qty 1

## 2011-10-15 NOTE — Discharge Instructions (Signed)

## 2011-10-15 NOTE — ED Provider Notes (Signed)
History     CSN: [id number]  Arrival date & time 10/15/11  0229   First MD Initiated Contact with Patient 10/15/11 0422      Chief Complaint  Patient presents with  . Migraine    (Consider location/radiation/quality/duration/timing/severity/associated sxs/prior treatment) HPI History provided by the patient. Has been taking opana for chronic pain, has ran out and now has developed a headache. Headache is frontal and throbbing in quality. No associated neck pain or stiffness. No rash or fevers. No weakness or numbness. No difficulty with speech or gait. Patient has history of intermittent headaches. Is not worse headache of life. is gradual onset. No chest pain shortness of breath Past Medical History  Diagnosis Date  . Chronic pain     No past surgical history on file.  No family history on file.  History  Substance Use Topics  . Smoking status: Current Everyday Smoker    Types: Cigarettes  . Smokeless tobacco: Not on file  . Alcohol Use: No      Review of Systems  Constitutional: Negative for fever and chills.  HENT: Negative for neck pain and neck stiffness.   Eyes: Negative for pain.  Respiratory: Negative for shortness of breath.   Cardiovascular: Negative for chest pain.  Gastrointestinal: Negative for abdominal pain.  Genitourinary: Negative for dysuria.  Musculoskeletal: Negative for back pain.  Skin: Negative for rash.  Neurological: Positive for headaches.  All other systems reviewed and are negative.    Allergies  Review of patient's allergies indicates no known allergies.  Home Medications   Current Outpatient Rx  Name Route Sig Dispense Refill  . GOODY HEADACHE PO Oral Take 1 Package by mouth 2 (two) times daily as needed. For headache    . OXYMORPHONE HCL 10 MG PO TABS Oral Take 1 tablet (10 mg total) by mouth every 4 (four) hours as needed for pain. 20 tablet 0    BP 132/72  Pulse 69  Temp(Src) 98.6 F (37 C) (Oral)  Resp 17  Ht 5\' 6"   (1.676 m)  Wt 165 lb (74.844 kg)  BMI 26.63 kg/m2  SpO2 96%  Physical Exam  Constitutional: He is oriented to person, place, and time. He appears well-developed and well-nourished.  HENT:  Head: Normocephalic and atraumatic.  Eyes: Conjunctivae and EOM are normal. Pupils are equal, round, and reactive to light.  Neck: Full passive range of motion without pain. Neck supple. No thyromegaly present.       No meningismus  Cardiovascular: Normal rate, regular rhythm, S1 normal, S2 normal and intact distal pulses.   Pulmonary/Chest: Effort normal and breath sounds normal.  Abdominal: Soft. Bowel sounds are normal. There is no tenderness. There is no CVA tenderness.  Musculoskeletal: Normal range of motion.  Neurological: He is alert and oriented to person, place, and time. He has normal strength and normal reflexes. No cranial nerve deficit or sensory deficit. He displays a negative Romberg sign. GCS eye subscore is 4. GCS verbal subscore is 5. GCS motor subscore is 6.       Normal Gait  Skin: Skin is warm and dry. No rash noted. No cyanosis. Nails show no clubbing.  Psychiatric: He has a normal mood and affect. His speech is normal and behavior is normal.    ED Course  Procedures (including critical care time)  iv fluids and headache cocktail provided  Recheck at 4:50 AM is feeling much better with headache resolved. No change normal neuro exam MDM   Headache, possibly  related to narcotic withdrawal. No vomiting diarrhea or significant symptoms otherwise. No indication for emergent CT scan or further workup at this time. Plan outpatient followup. Neurology referral provided as needed.        Sunnie Nielsen, MD 10/15/11 0500

## 2011-10-15 NOTE — ED Notes (Signed)
MD at bedside. 

## 2011-10-15 NOTE — ED Notes (Signed)
As per EMS, pt states has migraine for three day. Pt ambulated to EMS truck, states his morphine and fentanyl is not working. NO N/V.

## 2011-10-18 ENCOUNTER — Ambulatory Visit (INDEPENDENT_AMBULATORY_CARE_PROVIDER_SITE_OTHER): Payer: Medicaid Other | Admitting: Family Medicine

## 2011-10-18 ENCOUNTER — Encounter: Payer: Self-pay | Admitting: Family Medicine

## 2011-10-18 ENCOUNTER — Encounter (HOSPITAL_COMMUNITY): Payer: Self-pay | Admitting: *Deleted

## 2011-10-18 ENCOUNTER — Emergency Department (HOSPITAL_COMMUNITY)
Admission: EM | Admit: 2011-10-18 | Discharge: 2011-10-19 | Disposition: A | Discharge status: Home / Self Care | Payer: Medicaid Other | Attending: Emergency Medicine | Admitting: Emergency Medicine

## 2011-10-18 VITALS — BP 123/83 | HR 78 | Temp 98.2°F | Ht 66.0 in | Wt 139.0 lb

## 2011-10-18 DIAGNOSIS — G8929 Other chronic pain: Secondary | ICD-10-CM

## 2011-10-18 DIAGNOSIS — R45851 Suicidal ideations: Secondary | ICD-10-CM | POA: Insufficient documentation

## 2011-10-18 DIAGNOSIS — F411 Generalized anxiety disorder: Secondary | ICD-10-CM

## 2011-10-18 DIAGNOSIS — F17200 Nicotine dependence, unspecified, uncomplicated: Secondary | ICD-10-CM | POA: Insufficient documentation

## 2011-10-18 DIAGNOSIS — F3289 Other specified depressive episodes: Secondary | ICD-10-CM | POA: Insufficient documentation

## 2011-10-18 DIAGNOSIS — F419 Anxiety disorder, unspecified: Secondary | ICD-10-CM

## 2011-10-18 DIAGNOSIS — IMO0001 Reserved for inherently not codable concepts without codable children: Secondary | ICD-10-CM

## 2011-10-18 DIAGNOSIS — F329 Major depressive disorder, single episode, unspecified: Secondary | ICD-10-CM | POA: Insufficient documentation

## 2011-10-18 DIAGNOSIS — IMO0002 Reserved for concepts with insufficient information to code with codable children: Secondary | ICD-10-CM | POA: Insufficient documentation

## 2011-10-18 HISTORY — DX: Dorsalgia, unspecified: M54.9

## 2011-10-18 HISTORY — DX: Other psychoactive substance abuse, uncomplicated: F19.10

## 2011-10-18 LAB — COMPREHENSIVE METABOLIC PANEL
Alkaline Phosphatase: 69 U/L (ref 39–117)
BUN: 15 mg/dL (ref 6–23)
CO2: 24 mEq/L (ref 19–32)
Chloride: 108 mEq/L (ref 96–112)
Creatinine, Ser: 0.94 mg/dL (ref 0.50–1.35)
GFR calc Af Amer: >90 mL/min (ref >90)
GFR calc non Af Amer: >90 mL/min (ref >90)
Glucose, Bld: 96 mg/dL (ref 70–99)
Potassium: 3.8 mEq/L (ref 3.5–5.1)
Total Bilirubin: 0.3 mg/dL (ref 0.3–1.2)

## 2011-10-18 LAB — ETHANOL: Alcohol, Ethyl (B): <11 mg/dL (ref 0–11)

## 2011-10-18 LAB — RAPID URINE DRUG SCREEN, HOSP PERFORMED
Cocaine: NOT DETECTED
Opiates: NOT DETECTED
Tetrahydrocannabinol: NOT DETECTED

## 2011-10-18 LAB — CBC
Hemoglobin: 14 g/dL (ref 13.0–17.0)
MCH: 29.4 pg (ref 26.0–34.0)
MCHC: 34.8 g/dL (ref 30.0–36.0)
Platelets: 327 10*3/uL (ref 150–400)
RBC: 4.76 MIL/uL (ref 4.22–5.81)

## 2011-10-18 LAB — DIFFERENTIAL
Basophils Relative: 1 % (ref 0–1)
Eosinophils Absolute: 0.1 10*3/uL (ref 0.0–0.7)
Monocytes Relative: 6 % (ref 3–12)
Neutro Abs: 4.9 10*3/uL (ref 1.7–7.7)
Neutrophils Relative %: 59 % (ref 43–77)

## 2011-10-18 MED ORDER — OXYCODONE-ACETAMINOPHEN 5-325 MG PO TABS
2.0000 | ORAL_TABLET | Freq: Once | ORAL | Status: AC
  Administered 2011-10-18: 2 via ORAL
  Filled 2011-10-18: qty 2

## 2011-10-18 MED ORDER — KETOROLAC TROMETHAMINE 30 MG/ML IJ SOLN
30.0000 mg | Freq: Once | INTRAMUSCULAR | Status: AC
  Administered 2011-10-18: 30 mg via INTRAMUSCULAR
  Filled 2011-10-18: qty 1

## 2011-10-18 MED ORDER — FENTANYL 25 MCG/HR TD PT72
25.0000 ug | MEDICATED_PATCH | TRANSDERMAL | Status: DC
  Administered 2011-10-18: 25 ug via TRANSDERMAL
  Filled 2011-10-18: qty 1

## 2011-10-18 NOTE — ED Notes (Signed)
Report given to David RN.

## 2011-10-18 NOTE — ED Notes (Signed)
Security notified, pt wanded and belongings accouned for.

## 2011-10-18 NOTE — ED Notes (Signed)
Pt states he was sent here for help with pain management. Pt states that he has to take pain medication around the clock because of compression fractures and neck fractures. States he is out of pain medication.

## 2011-10-18 NOTE — ED Notes (Signed)
After pt was triaged stated he is having thoughts of SI because of the pain he is in, will have pt change into scrubs and will notify charge and house coverage

## 2011-10-18 NOTE — ED Provider Notes (Signed)
History     CSN: [id number]  Arrival date & time 10/18/11  1815   First MD Initiated Contact with Patient 10/18/11 2003      Chief Complaint  Patient presents with  . Medication Refill    HPI  History provided by the patient. Patient is a 51 year old male history of chronic pain polysubstance abuse who presents with complaints of uncontrolled chronic pains as well as increased depression and suicidal thoughts related to chronic pain. Patient reports being on pain management medications in the past and recently with a new pain management specialists. Patient reports having 2 visits pain management Center and was prescribed Percocets. Patient states this was not done with his chronic pain issues and he has since left the practice. Patient is currently not using any pain medications and reports that he's been bedridden with increased pain and depression. Patient states pain is ruining his life and he is so depressed that he would just rather die. Patient admits to increasing suicidal thoughts. Patient denies any specific plan. Patient has not made any attempts of suicide in the past. Patient is requesting help with pain today as well as help with depression and suicidal thoughts. Patient denies any other changes in chronic back pains. He denies any urinary or fecal incontinence. No weakness or numbness in lower extremities. Patient denies any perineal numbness.    Past Medical History  Diagnosis Date  . Chronic pain   . Polysubstance abuse   . Back pain     History reviewed. No pertinent past surgical history.  History reviewed. No pertinent family history.  History  Substance Use Topics  . Smoking status: Current Everyday Smoker -- 1.0 packs/day    Types: Cigarettes  . Smokeless tobacco: Not on file  . Alcohol Use: No      Review of Systems  Constitutional: Negative for fever and chills.  Respiratory: Negative for shortness of breath.   Cardiovascular: Negative for chest pain.   Gastrointestinal: Negative for nausea, vomiting, abdominal pain, diarrhea and constipation.  Genitourinary: Negative for dysuria, frequency, hematuria and flank pain.    Allergies  Review of patient's allergies indicates no known allergies.  Home Medications   Current Outpatient Rx  Name Route Sig Dispense Refill  . GOODY HEADACHE PO Oral Take 1 Package by mouth 2 (two) times daily as needed. For headache    . FENTANYL 50 MCG/HR TD PT72 Transdermal Place 1 patch onto the skin every 3 (three) days.    . IBUPROFEN 800 MG PO TABS Oral Take 800 mg by mouth every 6 (six) hours as needed. For pain    . OXYMORPHONE HCL 10 MG PO TABS Oral Take 1 tablet (10 mg total) by mouth every 4 (four) hours as needed for pain. 20 tablet 0    BP 134/90  Pulse 72  Temp(Src) 97.7 F (36.5 C) (Oral)  Resp 14  Wt 139 lb (63.05 kg)  SpO2 100%  Physical Exam  Nursing note and vitals reviewed. Constitutional: He is oriented to person, place, and time. He appears well-developed and well-nourished. No distress.  HENT:  Head: Normocephalic.  Cardiovascular: Normal rate and regular rhythm.   Pulmonary/Chest: Effort normal and breath sounds normal.  Abdominal: Soft.  Musculoskeletal:       Pain all along back. No saddle paresthesia.  Neurological: He is alert and oriented to person, place, and time.  Skin: Skin is warm.  Psychiatric: He is agitated. He exhibits a depressed mood. He expresses suicidal ideation.  ED Course  Procedures   Results for orders placed during the hospital encounter of 10/18/11  CBC      Component Value Range   WBC 8.3  4.0 - 10.5 (K/uL)   RBC 4.76  4.22 - 5.81 (MIL/uL)   Hemoglobin 14.0  13.0 - 17.0 (g/dL)   HCT 16.1  09.6 - 04.5 (%)   MCV 84.5  78.0 - 100.0 (fL)   MCH 29.4  26.0 - 34.0 (pg)   MCHC 34.8  30.0 - 36.0 (g/dL)   RDW 40.9  81.1 - 91.4 (%)   Platelets 327  150 - 400 (K/uL)  DIFFERENTIAL      Component Value Range   Neutrophils Relative 59  43 - 77 (%)     Neutro Abs 4.9  1.7 - 7.7 (K/uL)   Lymphocytes Relative 33  12 - 46 (%)   Lymphs Abs 2.8  0.7 - 4.0 (K/uL)   Monocytes Relative 6  3 - 12 (%)   Monocytes Absolute 0.5  0.1 - 1.0 (K/uL)   Eosinophils Relative 1  0 - 5 (%)   Eosinophils Absolute 0.1  0.0 - 0.7 (K/uL)   Basophils Relative 1  0 - 1 (%)   Basophils Absolute 0.0  0.0 - 0.1 (K/uL)  ETHANOL      Component Value Range   Alcohol, Ethyl (B) <11  0 - 11 (mg/dL)  URINE RAPID DRUG SCREEN (HOSP PERFORMED)      Component Value Range   Opiates NONE DETECTED  NONE DETECTED    Cocaine NONE DETECTED  NONE DETECTED    Benzodiazepines POSITIVE (*) NONE DETECTED    Amphetamines NONE DETECTED  NONE DETECTED    Tetrahydrocannabinol NONE DETECTED  NONE DETECTED    Barbiturates NONE DETECTED  NONE DETECTED   COMPREHENSIVE METABOLIC PANEL      Component Value Range   Sodium 143  135 - 145 (mEq/L)   Potassium 3.8  3.5 - 5.1 (mEq/L)   Chloride 108  96 - 112 (mEq/L)   CO2 24  19 - 32 (mEq/L)   Glucose, Bld 96  70 - 99 (mg/dL)   BUN 15  6 - 23 (mg/dL)   Creatinine, Ser 7.82  0.50 - 1.35 (mg/dL)   Calcium 9.3  8.4 - 95.6 (mg/dL)   Total Protein 6.8  6.0 - 8.3 (g/dL)   Albumin 3.8  3.5 - 5.2 (g/dL)   AST 11  0 - 37 (U/L)   ALT 10  0 - 53 (U/L)   Alkaline Phosphatase 69  39 - 117 (U/L)   Total Bilirubin 0.3  0.3 - 1.2 (mg/dL)   GFR calc non Af Amer >90  >90 (mL/min)   GFR calc Af Amer >90  >90 (mL/min)       1. Chronic pain       MDM  Patient seen and evaluated. Patient no acute distress.  Patient moved to yellow side.  Spoke with psychiatrist for tele psych consult. He will evaluate patient.   She has been evaluated by to psych and cleared for discharge. Does recommend Cymbalta 60 mg daily. Patient is ready to return home.   Dr. Marisa Sprinkles has evaluated patient and recommends discharge at this time with Cymbalta.  Angus Seller, [place] 10/19/11 2259

## 2011-10-18 NOTE — ED Notes (Signed)
Telepsych called and paperwork faxed. 

## 2011-10-19 MED ORDER — LORAZEPAM 1 MG PO TABS
1.0000 mg | ORAL_TABLET | Freq: Once | ORAL | Status: AC
  Administered 2011-10-19: 1 mg via ORAL
  Filled 2011-10-19: qty 1

## 2011-10-19 MED ORDER — DULOXETINE HCL 60 MG PO CPEP: 60.0000 mg | ORAL_CAPSULE | Freq: Every day | ORAL | Status: DC

## 2011-10-19 NOTE — ED Provider Notes (Signed)
Medical screening examination/treatment/procedure(s) were performed by non-physician practitioner and as supervising physician I was immediately available for consultation/collaboration.   Everette Dimauro L Tameron Lama, MD 10/19/11 2326 

## 2011-10-19 NOTE — Discharge Instructions (Signed)
You were seen and evaluated today for complaints of chronic pains and for depression. You evaluated by a psychiatrist until able to return home. Psychiatrist did recommend considering starting Cymbalta 60 mg once a day for your symptoms of depression. Please discuss this with your primary care provider or follow up with [facility].  Chronic Pain Chronic pain can be defined as pain that is lasting, off and on, and lasts for 3 to 6 months or longer. Many things cause chronic pain, which can make it difficult to make a discrete diagnosis. There are many treatment options available for chronic pain. However, finding a treatment that works well for you may require trying various approaches until a suitable one is found. CAUSES  In some types of chronic medical conditions, the pain is caused by a normal pain response within the body. A normal pain response helps the body identify illness or injury and prevent further damage from being done. In these cases, the cause of the pain may be identified and treated, even if it may not be cured completely. Examples of chronic conditions which can cause chronic pain include:  Inflammation of the joints (arthritis).   Back pain or neck pain (including bulging or herniated disks).   Migraine headaches.   Cancer.  In some other types of chronic pain syndromes, the pain is caused by an abnormal pain response within the body. An abnormal pain response is present when there is no ongoing cause (or stimulus) for the pain, or when the cause of the pain is arising from the nerves or nervous system itself. Examples of conditions which can cause chronic pain due to an abnormal pain response include:  Fibromyalgia.   Reflex sympathetic dystrophy (RSD).   Neuropathy (when the nerves themselves are damaged, and may cause pain).  DIAGNOSIS  Your caregiver will help diagnose your condition over time. In many cases, the initial focus will be on excluding conditions  that could be causing the pain. Depending on your symptoms, your caregiver may order some tests to diagnose your condition. Some of these tests include:  Blood tests.   Computerized X-ray scans (CT scan).   Computerized magnetic scans (MRI).   X-rays.   Ultrasounds.   Nerve conduction studies.   Consultation with other physicians or specialists.  TREATMENT  There are many treatment options for people suffering from chronic pain. Finding a treatment that works well may take time.   You may be referred to a pain management specialist.   You may be put on medication to help with the pain. Unfortunately, some medications (such as opiate medications) may not be very effective in cases where chronic pain is due to abnormal pain responses. Finding the right medications can take some time.   Adjunctive therapies may be used to provide additional relief and improve a patient's quality of life. These therapies include:   Mindfulness meditation.   Acupuncture.   Biofeedback.   Cognitive-behavioral therapy.   In certain cases, surgical interventions may be attempted.  HOME CARE INSTRUCTIONS   Make sure you understand these instructions prior to discharge.   Ask any questions and share any further concerns you have with your caregiver prior to discharge.   Take all medications as directed by your caregiver.   Keep all follow-up appointments.  SEEK MEDICAL CARE IF:   Your pain gets worse.   You develop a new pain that was not present before.   You cannot tolerate any medications prescribed by your caregiver.  You develop new symptoms since your last visit with your caregiver.  SEEK IMMEDIATE MEDICAL CARE IF:   You develop muscular weakness.   You have decreased sensation or numbness.   You lose control of bowel or bladder function.   Your pain suddenly gets much worse.   You have an oral temperature above 102 F (38.9 C), not controlled by medication.   You develop  shaking chills, confusion, chest pain, or shortness of breath.  Document Released: 02/18/2002 Document Revised: 05/18/2011 Document Reviewed: 05/27/2008 [facility] Patient Information 2012 [place], [place].   Depression, Adolescent and Adult Depression is a true and treatable medical condition. In general there are two kinds of depression:  Depression we all experience in some form. For example depression from the death of a loved one, financial distress or natural disasters will trigger or increase depression.   Clinical depression, on the other hand, appears without an apparent cause or reason. This depression is a disease. Depression may be caused by chemical imbalance in the body and brain or may come as a response to a physical illness. Alcohol and other drugs can cause depression.  DIAGNOSIS  The diagnosis of depression is usually based upon symptoms and medical history. TREATMENT  Treatments for depression fall into three categories. These are:  Drug therapy. There are many medicines that treat depression. Responses may vary and sometimes trial and error is necessary to determine the best medicines and dosage for a particular patient.   Psychotherapy, also called talking treatments, helps people resolve their problems by looking at them from a different point of view and by giving people insight into their own personal makeup. Traditional psychotherapy looks at a childhood source of a problem. Other psychotherapy will look at current conflicts and move toward solving those. If the cause of depression is drug use, counseling is available to help abstain. In time the depression will usually improve. If there were underlying causes for the chemical use, they can be addressed.   ECT (electroconvulsive therapy) or shock treatment is not as commonly used today. It is a very effective treatment for severe suicidal depression. During ECT electrical impulses are applied to the head. These impulses  cause a generalized seizure. It can be effective but causes a loss of memory for recent events. Sometimes this loss of memory may include the last several months.  Treat all depression or suicide threats as serious. Obtain professional help. Do not wait to see if serious depression will get better over time without help. Seek help for yourself or those around you. In the [place] the number to the National Suicide Help Lines With 24 Hour Help Are: 1-800-SUICIDE 404-356-0941 Document Released: 05/26/2000 Document Revised: 05/18/2011 Document Reviewed: 01/15/2008 [facility] Patient Information 2012 [place], [place].    RESOURCE GUIDE  Dental Problems  Patients with Medicaid: [facility] [phone] [address]                                           [phone] [address] Phone:  [phone]  Phone:  [phone]  If unable to pay or uninsured, contact:  [facility] or [facility]. to become qualified for the adult dental clinic.  Chronic Pain Problems Contact [facility]  [phone] Patients need to be referred by their primary care doctor.  Insufficient Money for Medicine Contact United Way:  call "211" or [facility] 3615722724.  No Primary Care Doctor Call [facility]  6715437623 Other agencies that provide inexpensive medical care    [facility]  [phone]    [facility]  [phone]    [facility]  7055373491    [facility]  [phone]    [facility]  [phone]    [facility]  [phone]  Psychological Services [facility]  [phone] [facility]  [phone] [facility]   [phone] (emergency services ([phone])  Substance Abuse Resources [facility]  ([phone] [facility] ([phone] [facility] [place]  3126490328 Floydene Flock [phone] Residential & Outpatient Substance Abuse Program  [phone]  Abuse/Neglect [facility] Child Abuse Hotline 256-051-4267 [facility] Child Abuse Hotline 316-875-1404 (After Hours)  Emergency Shelter [facility] [phone]  M[facility] [place] [facility] [place] [phone] [facility] 7828435157  MRSA Hotline #:   (708)400-7153    [facility] Resources  [facility] [place]     United [employer]                          [facility] [address] [place]                       [address]      [address]  Blondell Reveal Phone:  [phone]                                   Phone:  [phone]                 Phone:  [phone]  [facility] Phone:  [phone]  [facility] Child Abuse Hotline [phone] (After Hours)

## 2011-10-19 NOTE — ED Notes (Signed)
Pt. Discharged to home, pt. Alert and oriented, NAD noted 

## 2011-10-21 ENCOUNTER — Encounter: Payer: Self-pay | Admitting: Family Medicine

## 2011-10-21 DIAGNOSIS — F419 Anxiety disorder, unspecified: Secondary | ICD-10-CM | POA: Insufficient documentation

## 2011-10-21 DIAGNOSIS — M7918 Myalgia, other site: Secondary | ICD-10-CM | POA: Insufficient documentation
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
## 2011-10-21 NOTE — Assessment & Plan Note (Signed)
This pt is not appropriate for treatment at our clinic with pain medications. Willing to work with pt regarding improvement in function w/ various methods ie PT, injections, adjustments, referral for ortho again. Pt left clinic to go to ED for treatment tonight

## 2011-10-21 NOTE — Assessment & Plan Note (Signed)
Pt sounds like he needs admission to psychiatric ward or at the very least acute psych evaluation. Pt to go to ED for treatment.

## 2011-10-31 ENCOUNTER — Ambulatory Visit (HOSPITAL_COMMUNITY)
Admission: RE | Admit: 2011-10-31 | Discharge: 2011-10-31 | Disposition: A | Discharge status: Home / Self Care | Payer: Medicaid Other | Attending: Psychiatry | Admitting: Psychiatry

## 2011-10-31 DIAGNOSIS — F1120 Opioid dependence, uncomplicated: Secondary | ICD-10-CM | POA: Insufficient documentation
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
## 2011-11-02 ENCOUNTER — Ambulatory Visit: Payer: Medicaid Other | Admitting: Family Medicine

## 2011-11-07 ENCOUNTER — Encounter (HOSPITAL_COMMUNITY): Payer: Self-pay | Admitting: *Deleted

## 2011-11-07 ENCOUNTER — Emergency Department (HOSPITAL_COMMUNITY)
Admission: EM | Admit: 2011-11-07 | Discharge: 2011-11-07 | Discharge status: Against Medical Advice | Payer: Medicaid Other | Attending: Emergency Medicine | Admitting: Emergency Medicine

## 2011-11-07 DIAGNOSIS — G8929 Other chronic pain: Secondary | ICD-10-CM | POA: Insufficient documentation

## 2011-11-07 DIAGNOSIS — F341 Dysthymic disorder: Secondary | ICD-10-CM | POA: Insufficient documentation

## 2011-11-07 DIAGNOSIS — F1110 Opioid abuse, uncomplicated: Secondary | ICD-10-CM | POA: Insufficient documentation

## 2011-11-07 DIAGNOSIS — IMO0001 Reserved for inherently not codable concepts without codable children: Secondary | ICD-10-CM | POA: Insufficient documentation

## 2011-11-07 DIAGNOSIS — F1920 Other psychoactive substance dependence, uncomplicated: Secondary | ICD-10-CM

## 2011-11-07 LAB — URINALYSIS, ROUTINE W REFLEX MICROSCOPIC
Glucose, UA: NEGATIVE mg/dL
Ketones, ur: NEGATIVE mg/dL
Leukocytes, UA: NEGATIVE
Nitrite: NEGATIVE
Protein, ur: NEGATIVE mg/dL
Urobilinogen, UA: 0.2 mg/dL (ref 0.0–1.0)

## 2011-11-07 LAB — CBC
HCT: 39.6 % (ref 39.0–52.0)
Hemoglobin: 13.5 g/dL (ref 13.0–17.0)
Hemoglobin: 13.4 g/dL (ref 13.0–17.0)
MCH: 29.5 pg (ref 26.0–34.0)
MCH: 29.6 pg (ref 26.0–34.0)
MCHC: 33.8 g/dL (ref 30.0–36.0)
MCV: 88.4 fL (ref 78.0–100.0)
MCV: 87.4 fL (ref 78.0–100.0)
Platelets: 327 10*3/uL (ref 150–400)
RBC: 4.58 MIL/uL (ref 4.22–5.81)
WBC: 8.3 10*3/uL (ref 4.0–10.5)

## 2011-11-07 LAB — COMPREHENSIVE METABOLIC PANEL
ALT: 23 U/L (ref 0–53)
Alkaline Phosphatase: 90 U/L (ref 39–117)
BUN: 15 mg/dL (ref 6–23)
CO2: 28 mEq/L (ref 19–32)
GFR calc Af Amer: >90 mL/min (ref >90)
GFR calc non Af Amer: >90 mL/min (ref >90)
Glucose, Bld: 102 mg/dL — ABNORMAL HIGH (ref 70–99)
Potassium: 3.9 mEq/L (ref 3.5–5.1)
Sodium: 142 mEq/L (ref 135–145)

## 2011-11-07 LAB — DIFFERENTIAL
Eosinophils Relative: 3 % (ref 0–5)
Lymphocytes Relative: 28 % (ref 12–46)
Lymphs Abs: 2.3 10*3/uL (ref 0.7–4.0)
Monocytes Relative: 6 % (ref 3–12)
Neutrophils Relative %: 62 % (ref 43–77)

## 2011-11-07 LAB — ETHANOL: Alcohol, Ethyl (B): <11 mg/dL (ref 0–11)

## 2011-11-07 LAB — RAPID URINE DRUG SCREEN, HOSP PERFORMED
Amphetamines: NOT DETECTED
Opiates: NOT DETECTED

## 2011-11-07 MED ORDER — DIPHENHYDRAMINE HCL 25 MG PO CAPS
50.0000 mg | ORAL_CAPSULE | Freq: Once | ORAL | Status: AC
  Administered 2011-11-07: 50 mg via ORAL
  Filled 2011-11-07: qty 2

## 2011-11-07 MED ORDER — LORAZEPAM 1 MG PO TABS
1.0000 mg | ORAL_TABLET | Freq: Three times a day (TID) | ORAL | Status: DC | PRN
  Administered 2011-11-07: 1 mg via ORAL
  Filled 2011-11-07: qty 1

## 2011-11-07 MED ORDER — IBUPROFEN 200 MG PO TABS: 600.0000 mg | ORAL_TABLET | Freq: Four times a day (QID) | ORAL | Status: DC | PRN

## 2011-11-07 MED ORDER — ACETAMINOPHEN 325 MG PO TABS: 650.0000 mg | ORAL_TABLET | ORAL | Status: DC | PRN

## 2011-11-07 MED ORDER — DIPHENHYDRAMINE HCL 25 MG PO CAPS: 50.0000 mg | ORAL_CAPSULE | Freq: Every evening | ORAL | Status: DC | PRN

## 2011-11-07 MED ORDER — CLONIDINE HCL 0.1 MG PO TABS
0.1000 mg | ORAL_TABLET | Freq: Three times a day (TID) | ORAL | Status: DC
  Administered 2011-11-07: 0.1 mg via ORAL
  Filled 2011-11-07: qty 1

## 2011-11-07 MED ORDER — NICOTINE 21 MG/24HR TD PT24: 21.0000 mg | MEDICATED_PATCH | Freq: Every day | TRANSDERMAL | Status: DC

## 2011-11-07 MED ORDER — ONDANSETRON HCL 8 MG PO TABS: 4.0000 mg | ORAL_TABLET | Freq: Three times a day (TID) | ORAL | Status: DC | PRN

## 2011-11-07 NOTE — ED Notes (Signed)
Notified Baxter Hire that patient would like to go home, Baxter Hire to notify Dr. Fonnie Jarvis

## 2011-11-07 NOTE — ED Notes (Signed)
Pt. Reports that he has been using fentyl patches and taking oxycodone for approx 4 years. States he has chronic pain and has gone through withdraws before when he runs out of meds. Denies any seizure like activity ever. States that he is supposed to be getting an appointment with the pain clinic but it hasn't been set up yet and he no longer has any meds. He reports taking oxycodone and Vicodin yesterday which he got from a friend. And xanax and klonopin today from a friend. He reports that his muscles feel jittery and achy. States he has generalized pain at the moment 7/10. Denies nvd.

## 2011-11-07 NOTE — ED Notes (Signed)
He wants to be detoxed from oxycodone.  Las t time he took was yesterday
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
## 2011-11-07 NOTE — ED Notes (Signed)
Meal tray given, pt reports having anxiety. MD made aware and to follow up, will continue to monitor pt.

## 2011-11-07 NOTE — ED Notes (Signed)
Patient awake and calm, eating breakfast, denies pain.

## 2011-11-07 NOTE — ED Provider Notes (Signed)
Pt told [facility] he no longer wanted detox, as ACT was gathering paperwork and informing me of Pt's decision for discharge, the Pt eloped.  Hurman Horn, MD 11/08/11 [phone]

## 2011-11-07 NOTE — ED Provider Notes (Signed)
History     CSN: [id number]  Arrival date & time 11/07/11  0116   First MD Initiated Contact with Patient 11/07/11 0222      Chief Complaint  Patient presents with  . detox     (Consider location/radiation/quality/duration/timing/severity/associated sxs/prior treatment) HPI Patient is requesting detox from  narcotics. He presently feels that all of his muscles are tightening. He denies other symptoms. No treatment prior to coming here. Last use Vicodin 1 PM yesterday. No other complaint no other associated symptoms denies wishing to harm self or others  Past Medical History  Diagnosis Date  . Chronic pain   . Polysubstance abuse   . Back pain   . Depression   . Alcoholism   . Anxiety     Past Surgical History  Procedure Date  . Femur fracture surgery   . Fracture surgery     Family History  Problem Relation Age of Onset  . Alcohol abuse Mother   . Alcohol abuse Father   . Heart disease Father     Pacemaker    History  Substance Use Topics  . Smoking status: Current Everyday Smoker -- 0.5 packs/day for 38 years    Types: Cigarettes  . Smokeless tobacco: Not on file  . Alcohol Use: No      Review of Systems  Constitutional: Negative.   HENT: Negative.   Respiratory: Negative.   Cardiovascular: Negative.   Gastrointestinal: Negative.   Musculoskeletal: Positive for myalgias.  Skin: Negative.   Neurological: Negative.   Hematological: Negative.   Psychiatric/Behavioral: Negative.     Allergies  Review of patient's allergies indicates no known allergies.  Home Medications   Current Outpatient Rx  Name Route Sig Dispense Refill  . GOODY HEADACHE PO Oral Take 1 Package by mouth 2 (two) times daily as needed. For headache    . FENTANYL 50 MCG/HR TD PT72 Transdermal Place 1 patch onto the skin every 3 (three) days.    Marland Kitchen OXYMORPHONE HCL 10 MG PO TABS Oral Take 10 mg by mouth every 6 (six) hours.      BP 131/85  Pulse 71  Temp(Src) 98 F (36.7 C)  (Oral)  Resp 20  SpO2 97%  Physical Exam  Nursing note and vitals reviewed. Constitutional: He appears well-developed and well-nourished.  HENT:  Head: Normocephalic and atraumatic.  Eyes: Conjunctivae are normal. Pupils are equal, round, and reactive to light.  Neck: Neck supple. No tracheal deviation present. No thyromegaly present.  Cardiovascular: Normal rate and regular rhythm.   No murmur heard. Pulmonary/Chest: Effort normal and breath sounds normal.  Abdominal: Soft. Bowel sounds are normal. He exhibits no distension. There is no tenderness.  Musculoskeletal: Normal range of motion. He exhibits no edema and no tenderness.  Neurological: He is alert. Coordination normal.  Skin: Skin is warm and dry. No rash noted.  Psychiatric: He has a normal mood and affect.   Results for orders placed during the hospital encounter of 11/07/11  URINALYSIS, ROUTINE W REFLEX MICROSCOPIC      Component Value Range   Color, Urine YELLOW  YELLOW    APPearance CLEAR  CLEAR    Specific Gravity, Urine 1.024  1.005 - 1.030    pH 6.0  5.0 - 8.0    Glucose, UA NEGATIVE  NEGATIVE (mg/dL)   Hgb urine dipstick SMALL (*) NEGATIVE    Bilirubin Urine NEGATIVE  NEGATIVE    Ketones, ur NEGATIVE  NEGATIVE (mg/dL)   Protein, ur NEGATIVE  NEGATIVE (mg/dL)  Urobilinogen, UA 0.2  0.0 - 1.0 (mg/dL)   Nitrite NEGATIVE  NEGATIVE    Leukocytes, UA NEGATIVE  NEGATIVE   URINE RAPID DRUG SCREEN (HOSP PERFORMED)      Component Value Range   Opiates NONE DETECTED  NONE DETECTED    Cocaine NONE DETECTED  NONE DETECTED    Benzodiazepines POSITIVE (*) NONE DETECTED    Amphetamines NONE DETECTED  NONE DETECTED    Tetrahydrocannabinol NONE DETECTED  NONE DETECTED    Barbiturates NONE DETECTED  NONE DETECTED   CBC      Component Value Range   WBC 8.3  4.0 - 10.5 (K/uL)   RBC 4.58  4.22 - 5.81 (MIL/uL)   Hemoglobin 13.5  13.0 - 17.0 (g/dL)   HCT 09.6  04.5 - 40.9 (%)   MCV 88.4  78.0 - 100.0 (fL)   MCH 29.5  26.0  - 34.0 (pg)   MCHC 33.3  30.0 - 36.0 (g/dL)   RDW 81.1  91.4 - 78.2 (%)   Platelets 327  150 - 400 (K/uL)  DIFFERENTIAL      Component Value Range   Neutrophils Relative 62  43 - 77 (%)   Neutro Abs 5.1  1.7 - 7.7 (K/uL)   Lymphocytes Relative 28  12 - 46 (%)   Lymphs Abs 2.3  0.7 - 4.0 (K/uL)   Monocytes Relative 6  3 - 12 (%)   Monocytes Absolute 0.5  0.1 - 1.0 (K/uL)   Eosinophils Relative 3  0 - 5 (%)   Eosinophils Absolute 0.2  0.0 - 0.7 (K/uL)   Basophils Relative 1  0 - 1 (%)   Basophils Absolute 0.1  0.0 - 0.1 (K/uL)  COMPREHENSIVE METABOLIC PANEL      Component Value Range   Sodium 142  135 - 145 (mEq/L)   Potassium 3.9  3.5 - 5.1 (mEq/L)   Chloride 104  96 - 112 (mEq/L)   CO2 28  19 - 32 (mEq/L)   Glucose, Bld 102 (*) 70 - 99 (mg/dL)   BUN 15  6 - 23 (mg/dL)   Creatinine, Ser 9.56  0.50 - 1.35 (mg/dL)   Calcium 9.5  8.4 - 21.3 (mg/dL)   Total Protein 7.2  6.0 - 8.3 (g/dL)   Albumin 4.0  3.5 - 5.2 (g/dL)   AST 19  0 - 37 (U/L)   ALT 23  0 - 53 (U/L)   Alkaline Phosphatase 90  39 - 117 (U/L)   Total Bilirubin 0.1 (*) 0.3 - 1.2 (mg/dL)   GFR calc non Af Amer >90  >90 (mL/min)   GFR calc Af Amer >90  >90 (mL/min)  ETHANOL      Component Value Range   Alcohol, Ethyl (B) <11  0 - 11 (mg/dL)  URINE MICROSCOPIC-ADD ON      Component Value Range   Squamous Epithelial / LPF RARE  RARE    RBC / HPF 3-6  <3 (RBC/hpf)  ACETAMINOPHEN LEVEL      Component Value Range   Acetaminophen (Tylenol), Serum <15.0  10 - 30 (ug/mL)  CBC      Component Value Range   WBC 8.3  4.0 - 10.5 (K/uL)   RBC 4.53  4.22 - 5.81 (MIL/uL)   Hemoglobin 13.4  13.0 - 17.0 (g/dL)   HCT 08.6  57.8 - 46.9 (%)   MCV 87.4  78.0 - 100.0 (fL)   MCH 29.6  26.0 - 34.0 (pg)   MCHC 33.8  30.0 - 36.0 (g/dL)   RDW 40.9  81.1 - 91.4 (%)   Platelets 290  150 - 400 (K/uL)   No results found.   ED Course  Procedures (including critical care time)  Labs Reviewed  URINALYSIS, ROUTINE W REFLEX MICROSCOPIC  - Abnormal; Notable for the following:    Hgb urine dipstick SMALL (*)    All other components within normal limits  URINE RAPID DRUG SCREEN (HOSP PERFORMED)  CBC  DIFFERENTIAL  URINE MICROSCOPIC-ADD ON  COMPREHENSIVE METABOLIC PANEL  ETHANOL   No results found.   No diagnosis found.    MDM  [facility] team called to evaluate patient for detox placement Diagnosis narcotics addiction        Doug Sou, MD 11/07/11 [phone]

## 2011-11-07 NOTE — ED Notes (Signed)
Patient requesting medication for sleep; will give per orders.

## 2011-11-07 NOTE — BH Assessment (Signed)
Assessment Note  Update:  Per pt's nurse, pt stated he wanted to leave.  Consulted with EDP Bednar, who stated writer could give pt outpatient SA referrals, as pt requesting to leave, and that pt could be discharged.  However, pt left ED AMA.  Writer unable to give pt referrals.  Updated assessment disposition, completed assessment notification and faxed to [facility] to log.  Updated ED staff.     Disposition:  Disposition Disposition of Patient: Other dispositions Type of inpatient treatment program: Adult Other disposition(s): Other (Comment) (Pt left AMA) Patient referred to:  [facility] for detox request)  On Site Evaluation by:   Reviewed with Physician:  Diona Foley 11/07/2011 11:32 AM
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
## 2011-11-07 NOTE — ED Notes (Signed)
ACT Team at bedside for pt evaluations. 

## 2011-12-22 DIAGNOSIS — F3289 Other specified depressive episodes: Secondary | ICD-10-CM | POA: Insufficient documentation

## 2011-12-22 DIAGNOSIS — R45851 Suicidal ideations: Secondary | ICD-10-CM | POA: Insufficient documentation

## 2011-12-22 DIAGNOSIS — F329 Major depressive disorder, single episode, unspecified: Secondary | ICD-10-CM | POA: Insufficient documentation

## 2011-12-22 DIAGNOSIS — G8929 Other chronic pain: Secondary | ICD-10-CM | POA: Insufficient documentation

## 2011-12-22 NOTE — ED Notes (Addendum)
Having back problems, unable to get covered by pain clinic; reports nobody will fix it; wants help with his back and pain issues; reports he wants to kill himself if does not get any help; plan to shot shelf in head

## 2011-12-23 ENCOUNTER — Emergency Department (HOSPITAL_COMMUNITY): Payer: Medicaid Other

## 2011-12-23 ENCOUNTER — Encounter (HOSPITAL_COMMUNITY): Payer: Self-pay | Admitting: Emergency Medicine

## 2011-12-23 ENCOUNTER — Emergency Department (HOSPITAL_COMMUNITY)
Admission: EM | Admit: 2011-12-23 | Discharge: 2011-12-23 | Disposition: A | Discharge status: Home / Self Care | Payer: Medicaid Other | Attending: Emergency Medicine | Admitting: Emergency Medicine

## 2011-12-23 DIAGNOSIS — F329 Major depressive disorder, single episode, unspecified: Secondary | ICD-10-CM

## 2011-12-23 DIAGNOSIS — G8929 Other chronic pain: Secondary | ICD-10-CM

## 2011-12-23 LAB — RAPID URINE DRUG SCREEN, HOSP PERFORMED
Benzodiazepines: POSITIVE — AB
Cocaine: NOT DETECTED
Opiates: NOT DETECTED

## 2011-12-23 LAB — COMPREHENSIVE METABOLIC PANEL
BUN: 16 mg/dL (ref 6–23)
Calcium: 9.8 mg/dL (ref 8.4–10.5)
Creatinine, Ser: 0.69 mg/dL (ref 0.50–1.35)
GFR calc Af Amer: >90 mL/min (ref >90)
GFR calc non Af Amer: >90 mL/min (ref >90)
Glucose, Bld: 177 mg/dL — ABNORMAL HIGH (ref 70–99)
Sodium: 139 mEq/L (ref 135–145)
Total Protein: 7.8 g/dL (ref 6.0–8.3)

## 2011-12-23 LAB — CBC
HCT: 41.8 % (ref 39.0–52.0)
Hemoglobin: 14.7 g/dL (ref 13.0–17.0)
MCH: 29.9 pg (ref 26.0–34.0)
MCHC: 35.2 g/dL (ref 30.0–36.0)
MCV: 85.1 fL (ref 78.0–100.0)

## 2011-12-23 LAB — ACETAMINOPHEN LEVEL: Acetaminophen (Tylenol), Serum: <15 ug/mL (ref 10–30)

## 2011-12-23 MED ORDER — POTASSIUM CHLORIDE CRYS ER 20 MEQ PO TBCR
40.0000 meq | EXTENDED_RELEASE_TABLET | Freq: Once | ORAL | Status: AC
  Administered 2011-12-23: 40 meq via ORAL
  Filled 2011-12-23: qty 2

## 2011-12-23 MED ORDER — IBUPROFEN 400 MG PO TABS
600.0000 mg | ORAL_TABLET | Freq: Three times a day (TID) | ORAL | Status: DC | PRN
  Administered 2011-12-23: 600 mg via ORAL
  Filled 2011-12-23: qty 1

## 2011-12-23 MED ORDER — HYDROCODONE-ACETAMINOPHEN 5-325 MG PO TABS
1.0000 | ORAL_TABLET | ORAL | Status: DC | PRN
  Administered 2011-12-23 (×2): 2 via ORAL
  Filled 2011-12-23 (×2): qty 2

## 2011-12-23 MED ORDER — ONDANSETRON HCL 8 MG PO TABS: 4.0000 mg | ORAL_TABLET | Freq: Three times a day (TID) | ORAL | Status: DC | PRN

## 2011-12-23 MED ORDER — ALUM & MAG HYDROXIDE-SIMETH 200-200-20 MG/5ML PO SUSP: 30.0000 mL | ORAL | Status: DC | PRN

## 2011-12-23 MED ORDER — DIPHENHYDRAMINE HCL 25 MG PO CAPS
50.0000 mg | ORAL_CAPSULE | Freq: Once | ORAL | Status: AC
  Administered 2011-12-23: 50 mg via ORAL

## 2011-12-23 MED ORDER — NICOTINE 21 MG/24HR TD PT24
21.0000 mg | MEDICATED_PATCH | Freq: Every day | TRANSDERMAL | Status: DC
  Administered 2011-12-23: 21 mg via TRANSDERMAL
  Filled 2011-12-23: qty 1

## 2011-12-23 MED ORDER — LORAZEPAM 1 MG PO TABS
1.0000 mg | ORAL_TABLET | Freq: Three times a day (TID) | ORAL | Status: DC | PRN
  Administered 2011-12-23: 1 mg via ORAL
  Filled 2011-12-23: qty 1

## 2011-12-23 MED ORDER — HYDROCODONE-ACETAMINOPHEN 5-325 MG PO TABS: 1.0000 | ORAL_TABLET | Freq: Four times a day (QID) | ORAL | Status: AC | PRN

## 2011-12-23 MED ORDER — ZOLPIDEM TARTRATE 5 MG PO TABS: 5.0000 mg | ORAL_TABLET | Freq: Every evening | ORAL | Status: DC | PRN

## 2011-12-23 MED ORDER — DIPHENHYDRAMINE HCL 25 MG PO CAPS
ORAL_CAPSULE | ORAL | Status: AC
  Administered 2011-12-23: 50 mg via ORAL
  Filled 2011-12-23: qty 2

## 2011-12-23 NOTE — ED Notes (Signed)
ACT Team at bedside.  

## 2011-12-23 NOTE — ED Notes (Addendum)
Pt taken to radiology

## 2011-12-23 NOTE — ED Notes (Signed)
Radiology made aware that pt needs a CD of his Xrays from today.

## 2011-12-23 NOTE — ED Notes (Addendum)
Pt stated that he was not SI/HI at this time. Stated that he will call 911 or come back to the ED if he begins having Si/HI. Radiology CD given to pt.

## 2011-12-23 NOTE — ED Provider Notes (Signed)
History     CSN: [id number]  Arrival date & time 12/22/11  2338   First MD Initiated Contact with Patient 12/23/11 0034      Chief Complaint  Patient presents with  . Suicidal  . Pain     (Consider location/radiation/quality/duration/timing/severity/associated sxs/prior treatment) HPI Comments: Patient reports he has had chronic pain for many years - has broken many bones and has chronic pain in many areas.  He has a PCP who has sent him to a pain clinic before which he stopped going to because he felt the doctor did not listen to him or help with his pain.  He is supposed to be set up with a new pain clinic soon.  Pt has been on and off pain medications - patient has not had prescribed medications since May - gets pain medication from people at his church.  States being on pain medications then going through withdrawal and living in pain has made him often think of suicide because he hates living this way.  States he has been thinking about suicide for awhile.  Told nurse he was going to shoot himself.  Changes the subject when I ask specifically about a plan.   Admits to depression.  Denies change in his chronic pain.  Denies HI.   The history is provided by the patient.    Past Medical History  Diagnosis Date  . Chronic pain   . Polysubstance abuse   . Back pain   . Depression   . Alcoholism   . Anxiety     Past Surgical History  Procedure Date  . Femur fracture surgery   . Fracture surgery     Family History  Problem Relation Age of Onset  . Alcohol abuse Mother   . Alcohol abuse Father   . Heart disease Father     Pacemaker    History  Substance Use Topics  . Smoking status: Current Everyday Smoker -- 1.0 packs/day for 38 years    Types: Cigarettes  . Smokeless tobacco: Not on file  . Alcohol Use: No      Review of Systems  Constitutional: Negative for fever and chills.  Respiratory: Negative for shortness of breath.   Cardiovascular: Negative for chest  pain.  Gastrointestinal: Positive for diarrhea. Negative for nausea and vomiting.  Musculoskeletal: Positive for back pain.  Skin: Negative for wound.  Neurological: Negative for weakness and numbness.    Allergies  Review of patient's allergies indicates no known allergies.  Home Medications   Current Outpatient Rx  Name Route Sig Dispense Refill  . GOODY HEADACHE PO Oral Take 1 Package by mouth 2 (two) times daily as needed. For headache      BP 141/87  Pulse 88  Temp 97.9 F (36.6 C) (Oral)  Resp 20  SpO2 98%  Physical Exam  Nursing note and vitals reviewed. Constitutional: He is oriented to person, place, and time. He appears well-developed and well-nourished. No distress.  HENT:  Head: Normocephalic and atraumatic.  Neck: Neck supple.  Cardiovascular: Normal rate, regular rhythm and normal heart sounds.   Pulmonary/Chest: Breath sounds normal. No respiratory distress. He has no wheezes. He has no rales. He exhibits no tenderness.  Abdominal: Soft. Bowel sounds are normal. He exhibits no distension. There is no tenderness. There is no rebound and no guarding.  Neurological: He is alert and oriented to person, place, and time.  Skin: He is not diaphoretic.    ED Course  Procedures (including critical  care time)  Labs Reviewed  COMPREHENSIVE METABOLIC PANEL - Abnormal; Notable for the following:    Potassium 3.2 (*)     Glucose, Bld 177 (*)     Total Bilirubin 0.2 (*)     All other components within normal limits  URINE RAPID DRUG SCREEN (HOSP PERFORMED) - Abnormal; Notable for the following:    Benzodiazepines POSITIVE (*)     All other components within normal limits  CBC  ACETAMINOPHEN LEVEL  ETHANOL   No results found.  5:29 AM Patient seen by [facility] - pt now denying SI, states he is feeling hopeful, has a plan for follow up with his PCP and pain clinic next week.  Discussed patient with Dr Hyacinth Meeker who agrees with discharge.  Pt will sign safety contract.     1. Chronic pain   2. Depression       MDM  Patient with chronic pain, stating he initially claimed he was suicidal only in order to emphasize how much pain he was in and how frustrated he was.  Has been calm throughout visit, expresses hope to [facility] member and plan for future and for pain.  I believe he is safe for discharge.  I have discussed this with Dr Hyacinth Meeker who agrees with plan.  Pt to sign safety contract, follow up with PCP on Monday morning and follow with pain clinic.  I have provided a short supply of vicodin for his chronic pain (#15).  Radiology films provided on disc as this was apparently his barrier to being seen by new pain management clinic.         Dillard Cannon [place], [place] 12/23/11 [phone]

## 2011-12-23 NOTE — ED Provider Notes (Signed)
Medical screening examination/treatment/procedure(s) were performed by non-physician practitioner and as supervising physician I was immediately available for consultation/collaboration.    Vida Roller, MD 12/23/11 [phone]

## 2011-12-23 NOTE — ED Notes (Signed)
Pt stated that he has been having chronic back pain for years. Pain has been becoming increasingly worse x 4 years. He has not been to his pain doctor since May 2013. Has been taking friends medication. Reported last dose of pain medication was 3 days ago. Stated he had Xanax before coming to the ED. Stated that if he does not receive help he is going to "kill himself". He stated that he does not have a plan. He is not homicidal. Not having any hallucinations or delusions. Pt stated that he has a history of substance abuse. No cardiac or respiratory distress. Will continue to monitor.
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
## 2012-04-15 ENCOUNTER — Emergency Department (HOSPITAL_COMMUNITY)
Admission: EM | Admit: 2012-04-15 | Discharge: 2012-04-15 | Disposition: A | Discharge status: Home / Self Care | Payer: Medicaid Other | Attending: Emergency Medicine | Admitting: Emergency Medicine

## 2012-04-15 ENCOUNTER — Encounter (HOSPITAL_COMMUNITY): Payer: Self-pay | Admitting: *Deleted

## 2012-04-15 DIAGNOSIS — R112 Nausea with vomiting, unspecified: Secondary | ICD-10-CM | POA: Insufficient documentation

## 2012-04-15 DIAGNOSIS — F17200 Nicotine dependence, unspecified, uncomplicated: Secondary | ICD-10-CM | POA: Insufficient documentation

## 2012-04-15 DIAGNOSIS — R51 Headache: Secondary | ICD-10-CM

## 2012-04-15 DIAGNOSIS — G43909 Migraine, unspecified, not intractable, without status migrainosus: Secondary | ICD-10-CM | POA: Insufficient documentation

## 2012-04-15 DIAGNOSIS — Z8659 Personal history of other mental and behavioral disorders: Secondary | ICD-10-CM | POA: Insufficient documentation

## 2012-04-15 DIAGNOSIS — H53149 Visual discomfort, unspecified: Secondary | ICD-10-CM | POA: Insufficient documentation

## 2012-04-15 DIAGNOSIS — F1910 Other psychoactive substance abuse, uncomplicated: Secondary | ICD-10-CM | POA: Insufficient documentation

## 2012-04-15 HISTORY — DX: Migraine, unspecified, not intractable, without status migrainosus: G43.909

## 2012-04-15 MED ORDER — SODIUM CHLORIDE 0.9 % IV BOLUS (SEPSIS)
1000.0000 mL | Freq: Once | INTRAVENOUS | Status: AC
  Administered 2012-04-15: 1000 mL via INTRAVENOUS

## 2012-04-15 MED ORDER — LORAZEPAM 2 MG/ML IJ SOLN: 1.0000 mg | Freq: Once | INTRAMUSCULAR | Status: AC

## 2012-04-15 MED ORDER — LORAZEPAM 2 MG/ML IJ SOLN
INTRAMUSCULAR | Status: AC
  Administered 2012-04-15: 1 mg
  Filled 2012-04-15: qty 1

## 2012-04-15 MED ORDER — DIPHENHYDRAMINE HCL 50 MG/ML IJ SOLN
25.0000 mg | Freq: Once | INTRAMUSCULAR | Status: AC
  Administered 2012-04-15: 25 mg via INTRAVENOUS
  Filled 2012-04-15: qty 1

## 2012-04-15 MED ORDER — METOCLOPRAMIDE HCL 5 MG/ML IJ SOLN
10.0000 mg | Freq: Once | INTRAMUSCULAR | Status: AC
  Administered 2012-04-15: 10 mg via INTRAVENOUS
  Filled 2012-04-15: qty 2

## 2012-04-15 MED ORDER — METOCLOPRAMIDE HCL 10 MG PO TABS: 10.0000 mg | ORAL_TABLET | Freq: Four times a day (QID) | ORAL | Status: DC | PRN

## 2012-04-15 NOTE — ED Notes (Addendum)
Pt states HA for the past 2 days. Pt tried OTC tylenol and goody powder with no relief. Pt states hx of migraines. Pt states that his HA goes from the back of his neck to his forehead. Pt ambulatory no neuro deficits.

## 2012-04-15 NOTE — ED Provider Notes (Signed)
History     CSN: [id number]  Arrival date & time 04/15/12  4098   First MD Initiated Contact with Patient 04/15/12 0330      Chief Complaint  Patient presents with  . Headache    (Consider location/radiation/quality/duration/timing/severity/associated sxs/prior treatment) Patient is a 51 y.o. male presenting with headaches. The history is provided by the patient.  Headache   He complains of global headache for the last 2 days. Pain is throbbing and severe and he rates it 10/10. There is associated photophobia, nausea, vomiting. Denies visual changes. He denies photophobia. He has history of migraines and this headache is typical of his migraines. He has tried taking oxycodone with only slight relief. He denies fever, chills, sweats and denies stiff neck. There is no weakness, numbness, tingling. He denies visual changes.  Past Medical History  Diagnosis Date  . Chronic pain   . Polysubstance abuse   . Back pain   . Depression   . Alcoholism   . Anxiety   . Migraine     Past Surgical History  Procedure Date  . Femur fracture surgery   . Fracture surgery     Family History  Problem Relation Age of Onset  . Alcohol abuse Mother   . Alcohol abuse Father   . Heart disease Father     Pacemaker    History  Substance Use Topics  . Smoking status: Current Every Day Smoker -- 1.0 packs/day for 38 years    Types: Cigarettes  . Smokeless tobacco: Not on file  . Alcohol Use: No      Review of Systems  Neurological: Positive for headaches.  All other systems reviewed and are negative.    Allergies  Review of patient's allergies indicates no known allergies.  Home Medications   Current Outpatient Rx  Name  Route  Sig  Dispense  Refill  . GOODY HEADACHE PO   Oral   Take 1 Package by mouth 2 (two) times daily as needed. For headache           BP 123/76  Pulse 74  Temp 99 F (37.2 C) (Oral)  Resp 16  SpO2 99%  Physical Exam  Nursing note and vitals  reviewed. 51 year old male, who appears uncomfortable, but is in no acute distress. Vital signs are normal. Oxygen saturation is 99%, which is normal. Head is normocephalic and atraumatic. PERRLA, EOMI. Oropharynx is clear. Neck is nontender and supple without adenopathy or JVD. Back is nontender and there is no CVA tenderness. Lungs are clear without rales, wheezes, or rhonchi. Chest is nontender. Heart has regular rate and rhythm without murmur. Abdomen is soft, flat, nontender without masses or hepatosplenomegaly and peristalsis is normoactive. Extremities have no cyanosis or edema, full range of motion is present. Skin is warm and dry without rash. Neurologic: Mental status is normal, cranial nerves are intact, there are no motor or sensory deficits.   ED Course  Procedures (including critical care time)   1. Headache       MDM  Migraine headache. You'll be treated with a migraine cocktail. Old records were reviewed and he has prior ED visits for migraines which are treated with metoclopramide and diphenhydramine.  Headache was significantly improved with metoclopramide and diphenhydramine, but he did develop significant anxiety which was treated with lorazepam. He is feeling much better now and will be discharged with a prescription for metoclopramide.       Dione Booze, MD 04/15/12 [phone]

## 2012-04-15 NOTE — ED Notes (Signed)
The patient is AOx4 and comfortable with his discharge instructions. 

## 2012-04-16 DIAGNOSIS — M542 Cervicalgia: Secondary | ICD-10-CM | POA: Insufficient documentation

## 2012-04-16 DIAGNOSIS — Z8659 Personal history of other mental and behavioral disorders: Secondary | ICD-10-CM | POA: Insufficient documentation

## 2012-04-16 DIAGNOSIS — F1910 Other psychoactive substance abuse, uncomplicated: Secondary | ICD-10-CM | POA: Insufficient documentation

## 2012-04-16 DIAGNOSIS — F17200 Nicotine dependence, unspecified, uncomplicated: Secondary | ICD-10-CM | POA: Insufficient documentation

## 2012-04-16 DIAGNOSIS — G8929 Other chronic pain: Secondary | ICD-10-CM | POA: Insufficient documentation

## 2012-04-16 DIAGNOSIS — M129 Arthropathy, unspecified: Secondary | ICD-10-CM | POA: Insufficient documentation

## 2012-04-16 DIAGNOSIS — F411 Generalized anxiety disorder: Secondary | ICD-10-CM | POA: Insufficient documentation

## 2012-04-16 DIAGNOSIS — R52 Pain, unspecified: Secondary | ICD-10-CM | POA: Insufficient documentation

## 2012-04-16 DIAGNOSIS — Z79899 Other long term (current) drug therapy: Secondary | ICD-10-CM | POA: Insufficient documentation

## 2012-04-16 DIAGNOSIS — Z8669 Personal history of other diseases of the nervous system and sense organs: Secondary | ICD-10-CM | POA: Insufficient documentation

## 2012-04-17 ENCOUNTER — Encounter (HOSPITAL_COMMUNITY): Payer: Self-pay | Admitting: *Deleted

## 2012-04-17 ENCOUNTER — Emergency Department (HOSPITAL_COMMUNITY)
Admission: EM | Admit: 2012-04-17 | Discharge: 2012-04-17 | Disposition: A | Discharge status: Home / Self Care | Payer: Medicaid Other | Attending: Emergency Medicine | Admitting: Emergency Medicine

## 2012-04-17 DIAGNOSIS — G8929 Other chronic pain: Secondary | ICD-10-CM

## 2012-04-17 HISTORY — DX: Unspecified osteoarthritis, unspecified site: M19.90

## 2012-04-17 MED ORDER — MORPHINE SULFATE 4 MG/ML IJ SOLN
4.0000 mg | Freq: Once | INTRAMUSCULAR | Status: AC
  Administered 2012-04-17: 4 mg via INTRAMUSCULAR
  Filled 2012-04-17: qty 1

## 2012-04-17 MED ORDER — TRAMADOL HCL 50 MG PO TABS
100.0000 mg | ORAL_TABLET | Freq: Once | ORAL | Status: AC
  Administered 2012-04-17: 100 mg via ORAL
  Filled 2012-04-17: qty 2

## 2012-04-17 NOTE — ED Notes (Addendum)
Pt states that he is having generalized body pain. Pt has hx of "body pain" with surgery and broken bones. Pt states that his back and his neck and elbows are hurting him the worse. Pt states he is having anxiety attacks due to pain being so bad as well. Pt is also out of all medications,

## 2012-04-17 NOTE — ED Provider Notes (Signed)
Medical screening examination/treatment/procedure(s) were performed by non-physician practitioner and as supervising physician I was immediately available for consultation/collaboration.   Shelda Jakes, MD 04/17/12 [phone]

## 2012-04-17 NOTE — ED Provider Notes (Signed)
History     CSN: [id number]  Arrival date & time 04/16/12  2350   First MD Initiated Contact with Patient 04/17/12 0103      Chief Complaint  Patient presents with  . Back Pain  . Neck Pain  . Generalized Body Aches   HPI  History provided by the patient. Patient is a 51 year old male with history of chronic back and neck pains who presents with complaints of neck pain. Patient has a history of chronic pain medication use but states he has not been given any prescriptions or treatment recently. Patient does admit to using street narcotics to self medicate chronic pain symptoms. He was formally with pain management specialists. He denies any new injury or trauma. He also denies any new symptoms besides similar chronic pain. Pain does not radiate. He does occasionally report tingling and numbness in bilateral hands. He denies any weakness or numbness in lower extremities. He denies any urinary or fecal incontinence, urinary retention or perineal numbness. Symptoms are worse with certain movements and positions.     Past Medical History  Diagnosis Date  . Chronic pain   . Polysubstance abuse   . Back pain   . Depression   . Alcoholism   . Anxiety   . Migraine   . Arthritis     DDD    Past Surgical History  Procedure Date  . Femur fracture surgery   . Fracture surgery     Family History  Problem Relation Age of Onset  . Alcohol abuse Mother   . Alcohol abuse Father   . Heart disease Father     Pacemaker    History  Substance Use Topics  . Smoking status: Current Every Day Smoker -- 1.0 packs/day for 38 years    Types: Cigarettes  . Smokeless tobacco: Not on file  . Alcohol Use: No      Review of Systems  Constitutional: Negative for fever, chills, diaphoresis and unexpected weight change.  HENT: Positive for neck pain. Negative for neck stiffness.   Cardiovascular: Negative for chest pain.  Gastrointestinal: Negative for nausea and vomiting.    Musculoskeletal: Positive for back pain.  Skin: Negative for rash.  Neurological: Positive for headaches. Negative for weakness, light-headedness and numbness.  Psychiatric/Behavioral: Negative for confusion.    Allergies  Metoclopramide  Home Medications   Current Outpatient Rx  Name  Route  Sig  Dispense  Refill  . XANAX PO   Oral   Take 1 tablet by mouth daily as needed. Anxiety attacks         . OXYCODONE HCL ER 10 MG PO TB12   Oral   Take 10 mg by mouth every 12 (twelve) hours.           BP 141/89  Pulse 59  Temp 98.9 F (37.2 C) (Oral)  Resp 16  SpO2 100%  Physical Exam  Nursing note and vitals reviewed. Constitutional: He appears well-developed and well-nourished.  HENT:  Head: Normocephalic.  Neck: Normal range of motion. Neck supple.       No cervical midline tenderness  Cardiovascular: Normal rate and regular rhythm.   Pulmonary/Chest: Effort normal and breath sounds normal.  Abdominal: Soft.  Musculoskeletal:       Thoracic back: He exhibits tenderness. He exhibits normal range of motion.       Lumbar back: Normal.       Back:    ED Course  Procedures        1.  Chronic pain       MDM  1:00AM patient seen and evaluated. Patient with chronic pain symptoms. No new changes or injuries. Patient given dose of Ultram and small prescription of same.        Phill Mutter [place], [place] 04/17/12 [phone]

## 2012-04-19 ENCOUNTER — Emergency Department (HOSPITAL_COMMUNITY)
Admission: EM | Admit: 2012-04-19 | Discharge: 2012-04-19 | Disposition: A | Discharge status: Home / Self Care | Payer: Medicaid Other | Attending: Emergency Medicine | Admitting: Emergency Medicine

## 2012-04-19 ENCOUNTER — Encounter (HOSPITAL_COMMUNITY): Payer: Self-pay | Admitting: *Deleted

## 2012-04-19 DIAGNOSIS — F411 Generalized anxiety disorder: Secondary | ICD-10-CM | POA: Insufficient documentation

## 2012-04-19 DIAGNOSIS — G43909 Migraine, unspecified, not intractable, without status migrainosus: Secondary | ICD-10-CM | POA: Insufficient documentation

## 2012-04-19 DIAGNOSIS — Z8739 Personal history of other diseases of the musculoskeletal system and connective tissue: Secondary | ICD-10-CM | POA: Insufficient documentation

## 2012-04-19 DIAGNOSIS — G8929 Other chronic pain: Secondary | ICD-10-CM | POA: Insufficient documentation

## 2012-04-19 DIAGNOSIS — F1910 Other psychoactive substance abuse, uncomplicated: Secondary | ICD-10-CM | POA: Insufficient documentation

## 2012-04-19 DIAGNOSIS — R51 Headache: Secondary | ICD-10-CM

## 2012-04-19 DIAGNOSIS — F17200 Nicotine dependence, unspecified, uncomplicated: Secondary | ICD-10-CM | POA: Insufficient documentation

## 2012-04-19 DIAGNOSIS — F3289 Other specified depressive episodes: Secondary | ICD-10-CM | POA: Insufficient documentation

## 2012-04-19 DIAGNOSIS — F329 Major depressive disorder, single episode, unspecified: Secondary | ICD-10-CM | POA: Insufficient documentation

## 2012-04-19 DIAGNOSIS — Z79899 Other long term (current) drug therapy: Secondary | ICD-10-CM | POA: Insufficient documentation

## 2012-04-19 MED ORDER — PROMETHAZINE HCL 25 MG/ML IJ SOLN
25.0000 mg | Freq: Once | INTRAMUSCULAR | Status: AC
  Administered 2012-04-19: 25 mg via INTRAVENOUS
  Filled 2012-04-19: qty 1

## 2012-04-19 MED ORDER — DIPHENHYDRAMINE HCL 50 MG/ML IJ SOLN
12.5000 mg | Freq: Once | INTRAMUSCULAR | Status: AC
  Administered 2012-04-19: 12.5 mg via INTRAVENOUS
  Filled 2012-04-19: qty 1

## 2012-04-19 MED ORDER — SUMATRIPTAN SUCCINATE 6 MG/0.5ML ~~LOC~~ SOLN
6.0000 mg | Freq: Once | SUBCUTANEOUS | Status: AC
  Administered 2012-04-19: 6 mg via SUBCUTANEOUS
  Filled 2012-04-19: qty 0.5

## 2012-04-19 MED ORDER — DEXAMETHASONE SODIUM PHOSPHATE 10 MG/ML IJ SOLN
10.0000 mg | Freq: Once | INTRAMUSCULAR | Status: AC
  Administered 2012-04-19: 10 mg via INTRAVENOUS
  Filled 2012-04-19: qty 1

## 2012-04-19 MED ORDER — SODIUM CHLORIDE 0.9 % IV BOLUS (SEPSIS)
1000.0000 mL | Freq: Once | INTRAVENOUS | Status: AC
  Administered 2012-04-19: 1000 mL via INTRAVENOUS

## 2012-04-19 MED ORDER — KETOROLAC TROMETHAMINE 30 MG/ML IJ SOLN
30.0000 mg | Freq: Once | INTRAMUSCULAR | Status: AC
  Administered 2012-04-19: 30 mg via INTRAVENOUS
  Filled 2012-04-19: qty 1

## 2012-04-19 NOTE — ED Provider Notes (Signed)
History     CSN: [id number]  Arrival date & time 04/19/12  0020   First MD Initiated Contact with Patient 04/19/12 0225      Chief Complaint  Patient presents with  . Migraine    (Consider location/radiation/quality/duration/timing/severity/associated sxs/prior treatment) HPI History provided by pt.   Pt c/o severe, frontal headache since this morning.  Associated w/ photophobia and nausea; denies fever.  No relief w/ oxycodone.  Denies trauma.  Attributes to severe, chronic neck pain.   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  Has chronic and intermittent paresthesias of upper extremities.  No longer seeing a pain specialist.  Seen in ED often for chronic pain; 04/15/12 for migraine.     Past Medical History  Diagnosis Date  . Chronic pain   . Polysubstance abuse   . Back pain   . Depression   . Alcoholism   . Anxiety   . Migraine   . Arthritis     DDD    Past Surgical History  Procedure Date  . Femur fracture surgery   . Fracture surgery     Family History  Problem Relation Age of Onset  . Alcohol abuse Mother   . Alcohol abuse Father   . Heart disease Father     Pacemaker    History  Substance Use Topics  . Smoking status: Current Every Day Smoker -- 1.0 packs/day for 38 years    Types: Cigarettes  . Smokeless tobacco: Not on file  . Alcohol Use: No      Review of Systems  All other systems reviewed and are negative.    Allergies  Metoclopramide  Home Medications   Current Outpatient Rx  Name  Route  Sig  Dispense  Refill  . XANAX PO   Oral   Take 1 tablet by mouth daily as needed. Anxiety attacks         . OXYCODONE HCL ER 10 MG PO TB12   Oral   Take 10 mg by mouth every 12 (twelve) hours.           BP 138/79  Pulse 67  Temp 97.5 F (36.4 C) (Oral)  Resp 16  SpO2  98%  Physical Exam  Nursing note and vitals reviewed. Constitutional: He is oriented to person, place, and time. He appears well-developed and well-nourished. No distress.  HENT:  Head: Normocephalic and atraumatic.  Eyes:       Normal appearance  Neck: Normal range of motion.       No meningeal signs  Cardiovascular: Normal rate and regular rhythm.   Pulmonary/Chest: Effort normal and breath sounds normal. No respiratory distress.  Musculoskeletal: Normal range of motion.       No tenderness of cervical spine  Neurological: He is alert and oriented to person, place, and time.       CN 3-12 intact.  No sensory deficits.  5/5 and equal upper and lower extremity strength.  No past pointing.  Nml gait.   Skin: Skin is warm and dry. No rash noted.  Psychiatric: He has a normal mood and affect. His behavior is normal.    ED Course  Procedures (including critical care time)  Labs Reviewed - No data to display No results found.   1. Headache       MDM  51yo M presents  w/ frontal migraine headache that he attributes to severe, chronic neck pain.  Seen for chronic pain in ED frequently.  On exam, pt well-appearing, afebrile, no focal neuro deficits or meningeal signs.  Will treat w/ IV phenergan, decadron and benadryl.  2:50 AM   Pt reports no relief w/ migraine cocktail.  Will try IV toradol.  4:15 AM   Pt reported no relief. He started to pull his IV out when I told him that we would not attempt to treat his headache with the ativan he requested.  I suspect malingering.  He finally agreed to stay for a dose of imitrex and then reported a few minutes later that his pain was improving and he wanted to be discharged.  Return precautions discussed.           Otilio Miu, [place] 04/19/12 [phone]

## 2012-04-19 NOTE — ED Provider Notes (Signed)
Medical screening examination/treatment/procedure(s) were performed by non-physician practitioner and as supervising physician I was immediately available for consultation/collaboration.  Olivia Mackie, MD 04/19/12 2121

## 2012-04-19 NOTE — ED Notes (Signed)
Pt c/o migraine since this morning, nausea and photophobia.  Frontal head pain and neck pain

## 2012-05-26 ENCOUNTER — Emergency Department (HOSPITAL_COMMUNITY)
Admission: EM | Admit: 2012-05-26 | Discharge: 2012-05-26 | Disposition: A | Discharge status: Home / Self Care | Payer: Medicaid Other | Source: Home / Self Care | Attending: Emergency Medicine | Admitting: Emergency Medicine

## 2012-05-26 ENCOUNTER — Encounter (HOSPITAL_COMMUNITY): Payer: Self-pay | Admitting: *Deleted

## 2012-05-26 ENCOUNTER — Encounter (HOSPITAL_COMMUNITY): Payer: Self-pay | Admitting: Emergency Medicine

## 2012-05-26 ENCOUNTER — Emergency Department (HOSPITAL_COMMUNITY)
Admission: EM | Admit: 2012-05-26 | Discharge: 2012-05-26 | Disposition: A | Discharge status: Home / Self Care | Payer: Medicaid Other | Attending: Emergency Medicine | Admitting: Emergency Medicine

## 2012-05-26 DIAGNOSIS — R209 Unspecified disturbances of skin sensation: Secondary | ICD-10-CM | POA: Insufficient documentation

## 2012-05-26 DIAGNOSIS — Z8659 Personal history of other mental and behavioral disorders: Secondary | ICD-10-CM | POA: Insufficient documentation

## 2012-05-26 DIAGNOSIS — G8929 Other chronic pain: Secondary | ICD-10-CM | POA: Insufficient documentation

## 2012-05-26 DIAGNOSIS — Z8739 Personal history of other diseases of the musculoskeletal system and connective tissue: Secondary | ICD-10-CM | POA: Insufficient documentation

## 2012-05-26 DIAGNOSIS — F17200 Nicotine dependence, unspecified, uncomplicated: Secondary | ICD-10-CM | POA: Insufficient documentation

## 2012-05-26 DIAGNOSIS — Z8669 Personal history of other diseases of the nervous system and sense organs: Secondary | ICD-10-CM | POA: Insufficient documentation

## 2012-05-26 DIAGNOSIS — Z8781 Personal history of (healed) traumatic fracture: Secondary | ICD-10-CM | POA: Insufficient documentation

## 2012-05-26 DIAGNOSIS — M545 Low back pain, unspecified: Secondary | ICD-10-CM | POA: Insufficient documentation

## 2012-05-26 DIAGNOSIS — F1910 Other psychoactive substance abuse, uncomplicated: Secondary | ICD-10-CM | POA: Insufficient documentation

## 2012-05-26 DIAGNOSIS — F411 Generalized anxiety disorder: Secondary | ICD-10-CM | POA: Insufficient documentation

## 2012-05-26 DIAGNOSIS — F1010 Alcohol abuse, uncomplicated: Secondary | ICD-10-CM | POA: Insufficient documentation

## 2012-05-26 DIAGNOSIS — M542 Cervicalgia: Secondary | ICD-10-CM

## 2012-05-26 DIAGNOSIS — G43909 Migraine, unspecified, not intractable, without status migrainosus: Secondary | ICD-10-CM | POA: Insufficient documentation

## 2012-05-26 DIAGNOSIS — Z79899 Other long term (current) drug therapy: Secondary | ICD-10-CM | POA: Insufficient documentation

## 2012-05-26 DIAGNOSIS — F329 Major depressive disorder, single episode, unspecified: Secondary | ICD-10-CM | POA: Insufficient documentation

## 2012-05-26 DIAGNOSIS — F3289 Other specified depressive episodes: Secondary | ICD-10-CM | POA: Insufficient documentation

## 2012-05-26 DIAGNOSIS — M549 Dorsalgia, unspecified: Secondary | ICD-10-CM

## 2012-05-26 DIAGNOSIS — Z791 Long term (current) use of non-steroidal anti-inflammatories (NSAID): Secondary | ICD-10-CM | POA: Insufficient documentation

## 2012-05-26 DIAGNOSIS — R45851 Suicidal ideations: Secondary | ICD-10-CM | POA: Insufficient documentation

## 2012-05-26 DIAGNOSIS — F1021 Alcohol dependence, in remission: Secondary | ICD-10-CM | POA: Insufficient documentation

## 2012-05-26 DIAGNOSIS — IMO0002 Reserved for concepts with insufficient information to code with codable children: Secondary | ICD-10-CM | POA: Insufficient documentation

## 2012-05-26 LAB — COMPREHENSIVE METABOLIC PANEL
ALT: 12 U/L (ref 0–53)
AST: 12 U/L (ref 0–37)
Alkaline Phosphatase: 105 U/L (ref 39–117)
GFR calc Af Amer: >90 mL/min (ref >90)
Glucose, Bld: 173 mg/dL — ABNORMAL HIGH (ref 70–99)
Potassium: 4.1 mEq/L (ref 3.5–5.1)
Sodium: 134 mEq/L — ABNORMAL LOW (ref 135–145)
Total Protein: 7.5 g/dL (ref 6.0–8.3)

## 2012-05-26 LAB — CBC
Hemoglobin: 14.7 g/dL (ref 13.0–17.0)
MCHC: 35.1 g/dL (ref 30.0–36.0)
Platelets: 354 10*3/uL (ref 150–400)
RBC: 5.03 MIL/uL (ref 4.22–5.81)

## 2012-05-26 LAB — RAPID URINE DRUG SCREEN, HOSP PERFORMED
Amphetamines: NOT DETECTED
Barbiturates: NOT DETECTED
Benzodiazepines: POSITIVE — AB
Cocaine: NOT DETECTED
Tetrahydrocannabinol: NOT DETECTED

## 2012-05-26 LAB — ACETAMINOPHEN LEVEL: Acetaminophen (Tylenol), Serum: <15 ug/mL (ref 10–30)

## 2012-05-26 MED ORDER — ALUM & MAG HYDROXIDE-SIMETH 200-200-20 MG/5ML PO SUSP: 30.0000 mL | ORAL | Status: DC | PRN

## 2012-05-26 MED ORDER — ZOLPIDEM TARTRATE 5 MG PO TABS: 5.0000 mg | ORAL_TABLET | Freq: Every evening | ORAL | Status: DC | PRN

## 2012-05-26 MED ORDER — KETOROLAC TROMETHAMINE 60 MG/2ML IM SOLN
60.0000 mg | Freq: Once | INTRAMUSCULAR | Status: AC
  Administered 2012-05-26: 60 mg via INTRAMUSCULAR
  Filled 2012-05-26: qty 2

## 2012-05-26 MED ORDER — LORAZEPAM 1 MG PO TABS
1.0000 mg | ORAL_TABLET | Freq: Three times a day (TID) | ORAL | Status: DC | PRN
  Administered 2012-05-26 (×2): 1 mg via ORAL
  Filled 2012-05-26 (×2): qty 1

## 2012-05-26 MED ORDER — IBUPROFEN 600 MG PO TABS
600.0000 mg | ORAL_TABLET | Freq: Three times a day (TID) | ORAL | Status: DC | PRN
  Administered 2012-05-26 (×2): 600 mg via ORAL
  Filled 2012-05-26: qty 3
  Filled 2012-05-26: qty 1

## 2012-05-26 MED ORDER — ACETAMINOPHEN 325 MG PO TABS: 650.0000 mg | ORAL_TABLET | ORAL | Status: DC | PRN

## 2012-05-26 MED ORDER — LORAZEPAM 1 MG PO TABS: 1.0000 mg | ORAL_TABLET | Freq: Three times a day (TID) | ORAL | Status: DC | PRN

## 2012-05-26 MED ORDER — ONDANSETRON HCL 4 MG PO TABS: 4.0000 mg | ORAL_TABLET | Freq: Three times a day (TID) | ORAL | Status: DC | PRN

## 2012-05-26 MED ORDER — DEXAMETHASONE SODIUM PHOSPHATE 10 MG/ML IJ SOLN
10.0000 mg | Freq: Once | INTRAMUSCULAR | Status: AC
  Administered 2012-05-26: 10 mg via INTRAMUSCULAR
  Filled 2012-05-26: qty 1

## 2012-05-26 MED ORDER — NICOTINE 21 MG/24HR TD PT24
21.0000 mg | MEDICATED_PATCH | Freq: Every day | TRANSDERMAL | Status: DC | PRN
  Administered 2012-05-26: 21 mg via TRANSDERMAL
  Filled 2012-05-26: qty 1

## 2012-05-26 MED ORDER — MELOXICAM 7.5 MG PO TABS: 15.0000 mg | ORAL_TABLET | Freq: Every day | ORAL | Status: DC

## 2012-05-26 NOTE — ED Notes (Signed)
Pt states that he was seen at [facility] ED this morning for back pain. He was given two shots and sent home. 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  Pt states that when he goes through withdrawals he has the Suicidal ideas. He is currently withdrawing from oxycodone 10, pt states he took three at a time. Pt states that he hasn't had any in two days.

## 2012-05-26 NOTE — ED Notes (Signed)
Pt verbally aggressive, "flicking off" staff, cussing at staff, throwing food and drinks, pushing call bell buttons. Dr. Effie Shy called and made aware that pt wants to leave, stated pt need to sign a no harm contract. Security talked to pt.

## 2012-05-26 NOTE — BH Assessment (Signed)
Assessment Note   Jared [name] is an 51 y.o. male.   Pt discharged from [facility] ED this AM by Dr. Hyacinth Meeker due to patient seeking pain meds.  Pt arrived to [facility] seeking pain meds.  Pt initially reported feelings of suicide to nurses upon arrival.  Pt evaluated and medically cleared.  ED MD met with pt in front of counselor and informed pt he was not going to prescribe narcotics to patient.  Pt reported he understood and asked to be discharged.  Pt met with counselor and backtracked his initial report of feeling unsafe.    Pt established good eye contact, speech was clear and fluid, pt Ox4, pt demonstrated a steady gait, did not appear to be going through DTs but did report chills.  Pt reported anxiety of which the MD may provide some relief for at discharge.  Pt contracted for safety.  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  Pt denies psychosis or HI related behaviors.  Pt lives alone but reports he has supportive neighbors.  Pt denies any interest in committing suicide or hurting others.   When counselor asked pt what he was going to do now that he knows the ED is not going to prescribe him pain pills pt responded, "Well that's the reason I came in here.  I guess I figure out where I can get them from."  When asked about other clinics he had been too in the past and which ones worked well for him in meeting his needs the pt responded "Man, I have been to all kind of places and they are all the same.  None want to give you what you need so you just got keeping on moving to the next place."  Based on review with patient, Dr. Hyacinth Meeker notes from this AM and Dr. Effie Shy report, it is the opinion of this Counselor that the pt is med seeking based on self report from the pt.  Pt does not appear to pose a threat to self or others and through his own admission agrees with this assessment.  Pt recommended to follow up with [facility] for outpatient services.  Also the [facility]  offers psych and talk therapy as well.  Patient verbalized his awareness for the pain med clinics in the area and he may follow up with them if he so chooses.  Axis I: Substance Induced Mood Disorder Axis II: Deferred Axis III:  Past Medical History  Diagnosis Date  . Chronic pain   . Polysubstance abuse   . Back pain   . Depression   . Alcoholism   . Anxiety   . Migraine   . Arthritis     DDD   Axis IV: economic problems, educational problems, occupational problems, other psychosocial or environmental problems, problems related to social environment and problems with primary support group Axis V: 51-60 moderate symptoms  Past Medical History:  Past Medical History  Diagnosis Date  . Chronic pain   . Polysubstance abuse   . Back pain   . Depression   . Alcoholism   . Anxiety   . Migraine   . Arthritis     DDD    Past Surgical History  Procedure Date  . Femur fracture surgery   . Fracture surgery     Family History:  Family History  Problem Relation Age of Onset  . Alcohol abuse Mother   . Alcohol abuse Father   .  Heart disease Father     Pacemaker    Social History:  reports that he has been smoking Cigarettes.  He has a 38 pack-year smoking history. He does not have any smokeless tobacco history on file. He reports that he does not drink alcohol or use illicit drugs.  Additional Social History:  Alcohol / Drug Use Pain Medications: yes Prescriptions: na Over the Counter: na History of alcohol / drug use?: Yes  CIWA: CIWA-Ar BP: 127/75 mmHg COWS:    Allergies:  Allergies  Allergen Reactions  . Metoclopramide Other (See Comments)    jittery    Home Medications:  (Not in a hospital admission)  OB/GYN Status:  No LMP for male patient.  General Assessment Data Location of Assessment: [facility] ED Living Arrangements: Alone Can pt return to current living arrangement?: Yes Admission Status: Voluntary Is patient capable of signing voluntary admission?:  Yes Transfer from: Acute Hospital Referral Source: MD  Education Status Is patient currently in school?: No  Risk to self Suicidal Ideation: No Suicidal Intent: No Is patient at risk for suicide?: No Suicidal Plan?: No Access to Means: No What has been your use of drugs/alcohol within the last 12 months?: no Previous Attempts/Gestures: Yes How many times?: 2  Other Self Harm Risks: no Triggers for Past Attempts: Unpredictable Intentional Self Injurious Behavior: None Family Suicide History: No Recent stressful life event(s): Other (Comment) (pt using oxycodone and has been off it for 2 days) Persecutory voices/beliefs?: No Depression: Yes Depression Symptoms: Fatigue Substance abuse history and/or treatment for substance abuse?: No Suicide prevention information given to non-admitted patients: Yes  Risk to Others Homicidal Ideation: No Thoughts of Harm to Others: No Current Homicidal Intent: No Current Homicidal Plan: No Access to Homicidal Means: No Identified Victim: no History of harm to others?: No Assessment of Violence: None Noted Violent Behavior Description: cooperative and calm with ACT and ED MD Does patient have access to weapons?: No Criminal Charges Pending?: No Does patient have a court date: No  Psychosis Hallucinations: None noted Delusions: None noted  Mental Status Report Appear/Hygiene: Disheveled Eye Contact: Good Motor Activity: Restlessness Speech: Logical/coherent Level of Consciousness: Alert Mood: Depressed Affect: Anxious Anxiety Level: Minimal Thought Processes: Coherent Judgement: Unimpaired Orientation: Person;Time;Place;Situation Obsessive Compulsive Thoughts/Behaviors: None  Cognitive Functioning Concentration: Decreased Memory: Recent Intact;Remote Intact IQ: Average Insight: Good Impulse Control: Fair Appetite: Fair Weight Loss: 0  Weight Gain: 0  Sleep: No Change Total Hours of Sleep: 8  Vegetative Symptoms:  None  ADLScreening [facility] Assessment [facility]) Patient's cognitive ability adequate to safely complete daily activities?: Yes Patient able to express need for assistance with ADLs?: Yes Independently performs ADLs?: Yes (appropriate for developmental age)  Abuse/Neglect [facility]) Physical Abuse: Denies Verbal Abuse: Denies Sexual Abuse: Denies  Prior Inpatient Therapy Prior Inpatient Therapy: Yes Prior Therapy Dates: 3 yrs ago Prior Therapy Facilty/Provider(s): [facility] Reason for Treatment: depression  Prior Outpatient Therapy Prior Outpatient Therapy: Yes Prior Therapy Dates: 2012 Prior Therapy Facilty/Provider(s): [facility] Reason for Treatment: therapy  ADL Screening (condition at time of admission) Patient's cognitive ability adequate to safely complete daily activities?: Yes Patient able to express need for assistance with ADLs?: Yes Independently performs ADLs?: Yes (appropriate for developmental age) Weakness of Legs: None Weakness of Arms/Hands: None  Home Assistive Devices/Equipment Home Assistive Devices/Equipment: None  Therapy Consults (therapy consults require a physician order) PT Evaluation Needed: No OT Evalulation Needed: No SLP Evaluation Needed: No Abuse/Neglect Assessment (Assessment to be complete while patient is alone) Physical Abuse:  Denies Verbal Abuse: Denies Sexual Abuse: Denies Exploitation of patient/patient's resources: Denies Self-Neglect: Denies Values / Beliefs Cultural Requests During Hospitalization: None Spiritual Requests During Hospitalization: None Consults Spiritual Care Consult Needed: No Social Work Consult Needed: No Merchant navy officer (For Healthcare) Advance Directive: Patient does not have advance directive Pre-existing out of facility DNR order (yellow form or pink MOST form): No    Additional Information 1:1 In Past 12 Months?: No CIRT Risk: No Elopement Risk: No Does patient have medical clearance?: Yes      Disposition:   Referred to [facility]  Disposition Disposition of Patient: Referred to ([facility]) Patient referred to: Other (Comment) ([facility] and [facility])  On Site Evaluation by:   Reviewed with Physician:     Macon Large 05/26/2012 4:47 PM

## 2012-05-26 NOTE — ED Provider Notes (Signed)
History     CSN: [id number]  Arrival date & time 05/26/12  1150   First MD Initiated Contact with Patient 05/26/12 1357      Chief Complaint  Patient presents with  . Suicidal  . Back Pain    (Consider location/radiation/quality/duration/timing/severity/associated sxs/prior treatment) HPI Comments: Jared [name] is a 51 y.o. Male who is here for evaluation of suicidal ideation. The onset is insidious. He became worse today. He has no suicidal plan. He lives alone in a trailer. He called the [facility] because he was suicidal, today. He was in the ED, at [facility], last night for pain control, for low back pain. He received IM analgesia, using Toradol, and Decadron; and was discharged with a prescription for Mobic. Patient states his back pain, initially, improved, then returned. The pain is in his upper and lower back. His arms, shoulders, and elbows is typical of his chronic pain. He has been in a pain clinic, but stopped going there because he did not tolerate the fentanyl patch. Since then. He has been seen various providers and using her contacts, intermittently. He does not currently see a therapist or psychiatrist. He, states he has never seen a therapist. He denies change in bowels or urine. He has had no fever, chills, nausea, or vomiting. His back pain is worse with movement. There are no alleviating factors.   Patient is a 51 y.o. male presenting with back pain. The history is provided by the patient.  Back Pain     Past Medical History  Diagnosis Date  . Chronic pain   . Polysubstance abuse   . Back pain   . Depression   . Alcoholism   . Anxiety   . Migraine   . Arthritis     DDD    Past Surgical History  Procedure Date  . Femur fracture surgery   . Fracture surgery     Family History  Problem Relation Age of Onset  . Alcohol abuse Mother   . Alcohol abuse Father   . Heart disease Father     Pacemaker    History  Substance Use Topics  . Smoking status:  Current Every Day Smoker -- 1.0 packs/day for 38 years    Types: Cigarettes  . Smokeless tobacco: Not on file  . Alcohol Use: No      Review of Systems  Musculoskeletal: Positive for back pain.  All other systems reviewed and are negative.    Allergies  Metoclopramide  Home Medications   Current Outpatient Rx  Name  Route  Sig  Dispense  Refill  . MELOXICAM 7.5 MG PO TABS   Oral   Take 2 tablets (15 mg total) by mouth daily.   30 tablet   0     BP 127/75  Temp 98.7 F (37.1 C) (Oral)  Resp 18  SpO2 97%  Physical Exam  Nursing note and vitals reviewed. Constitutional: He is oriented to person, place, and time. He appears well-developed and well-nourished.  HENT:  Head: Normocephalic and atraumatic.  Right Ear: External ear normal.  Left Ear: External ear normal.  Eyes: Conjunctivae normal and EOM are normal. Pupils are equal, round, and reactive to light.  Neck: Normal range of motion and phonation normal. Neck supple.  Cardiovascular: Normal rate, regular rhythm, normal heart sounds and intact distal pulses.   Pulmonary/Chest: Effort normal and breath sounds normal. He exhibits no bony tenderness.  Abdominal: Soft. Normal appearance. There is no tenderness.  Musculoskeletal:  Diffuse cervical, thoracic, and lumbar spine tenderness. He is able to walk without significant disability or splinting of the spine.  Neurological: He is alert and oriented to person, place, and time. He has normal strength. No cranial nerve deficit or sensory deficit. He exhibits normal muscle tone. Coordination normal.  Skin: Skin is warm, dry and intact.  Psychiatric: His behavior is normal. Judgment and thought content normal.       Anxious    ED Course  Procedures (including critical care time)   Psychiatric Holding Orders written.  Tele-psychiatric consult requested.    Labs Reviewed  COMPREHENSIVE METABOLIC PANEL - Abnormal; Notable for the following:    Sodium 134  (*)     Glucose, Bld 173 (*)     Total Bilirubin 0.1 (*)     All other components within normal limits  SALICYLATE LEVEL - Abnormal; Notable for the following:    Salicylate Lvl <2.0 (*)     All other components within normal limits  URINE RAPID DRUG SCREEN (HOSP PERFORMED) - Abnormal; Notable for the following:    Benzodiazepines POSITIVE (*)     All other components within normal limits  ACETAMINOPHEN LEVEL  CBC  ETHANOL   The patient is medically cleared   Nursing notes, applicable records and vitals reviewed.  Radiologic Images/Reports reviewed.    1. Chronic pain   2. Suicidal ideation       MDM  Suicidal ideation, with chronic pain. Patient seems to be bargaining for pain medication. He told me that when he uses pain medicine, he has not suicidal. He does not currently have a pain Dr. I do not believe that he should receive narcotics from the emergency department. He'll be evaluated by the on-call psychiatrist. ACT has been consulted.       Flint Melter, MD 05/26/12 [phone]

## 2012-05-26 NOTE — ED Notes (Signed)
Dr. Effie Shy called and notified pt wants pain medication, no new orders at this time

## 2012-05-26 NOTE — ED Notes (Signed)
Pt belongings as follows: Black framed reading glasses, grey and black sweater, blue t-shirt, green ford hat, one brown belt, one pair of blue jeans, one pair of brown socks, one purple lighter, one pair of boxers, one pair of white gym shoes, one black wallet with id card, one small blue new testament bible, one kyocera cell phone with charger, one set of keys.  All placed in nurses station and have been cleared by security.

## 2012-05-26 NOTE — ED Notes (Signed)
EDP into room 

## 2012-05-26 NOTE — ED Notes (Addendum)
C/o chronic neck and back pain, increased and worsened around 2100 last night. (Denies: new injury, numbness, tingling, nv, fever, loss of control of bowel or bladder, weakness or any other sx other than pain). No meds taken PTA. Takes oxycodone ("and ativan when he can get it from his friend"). Seen here previously for the same.

## 2012-05-26 NOTE — ED Notes (Signed)
Pt given discharge instructions, explained, given 2 bags of clothes and escorted to discharge window.

## 2012-05-26 NOTE — ED Provider Notes (Signed)
History     CSN: [id number]  Arrival date & time 05/26/12  4696   First MD Initiated Contact with Patient 05/26/12 [phone]      Chief Complaint  Patient presents with  . Back Pain  . Neck Pain    (Consider location/radiation/quality/duration/timing/severity/associated sxs/prior treatment) HPI Comments: 51 year old male with a history of chronic back pain and chronic neck pain after he had an accident many years ago causing fractures and degenerative disc disease. He has been told that his injuries are inoperable, he has chronic daily pain which over the last couple of days seems to be worse. He describes a sharp and burning pain radiating from his lower back up to his neck and on the sides of his back as well. He denies any numbness weakness or difficulty walking, he has no urinary retention or incontinence and he denies a history of cancer, fevers or dysuria. He does note intermittent numbness to his hands, they are not numb at this time.  Patient is a 51 y.o. male presenting with back pain and neck pain. The history is provided by the patient and medical records.  Back Pain  Associated symptoms include numbness. Pertinent negatives include no fever and no weakness.  Neck Pain  Associated symptoms include numbness. Pertinent negatives include no weakness.    Past Medical History  Diagnosis Date  . Chronic pain   . Polysubstance abuse   . Back pain   . Depression   . Alcoholism   . Anxiety   . Migraine   . Arthritis     DDD    Past Surgical History  Procedure Date  . Femur fracture surgery   . Fracture surgery     Family History  Problem Relation Age of Onset  . Alcohol abuse Mother   . Alcohol abuse Father   . Heart disease Father     Pacemaker    History  Substance Use Topics  . Smoking status: Current Every Day Smoker -- 1.0 packs/day for 38 years    Types: Cigarettes  . Smokeless tobacco: Not on file  . Alcohol Use: No      Review of Systems   Constitutional: Negative for fever and chills.  HENT: Positive for neck pain.   Cardiovascular: Negative for leg swelling.  Gastrointestinal: Negative for nausea and vomiting.       No incontinence of bowel  Genitourinary: Negative for difficulty urinating.       No incontinence or retention  Musculoskeletal: Positive for back pain.  Skin: Negative for rash.  Neurological: Positive for numbness. Negative for weakness.    Allergies  Metoclopramide  Home Medications   Current Outpatient Rx  Name  Route  Sig  Dispense  Refill  . XANAX PO   Oral   Take 1 tablet by mouth daily as needed. Anxiety attacks         . MELOXICAM 7.5 MG PO TABS   Oral   Take 2 tablets (15 mg total) by mouth daily.   30 tablet   0   . OXYCODONE HCL ER 10 MG PO TB12   Oral   Take 10 mg by mouth every 12 (twelve) hours.           BP 132/88  Pulse 77  Temp 97.2 F (36.2 C) (Oral)  Resp 18  SpO2 100%  Physical Exam  Nursing note and vitals reviewed. Constitutional: He appears well-developed and well-nourished.  HENT:  Head: Normocephalic and atraumatic.  Eyes: Conjunctivae normal  are normal. No scleral icterus.  Cardiovascular: Normal rate, regular rhythm and intact distal pulses.   Pulmonary/Chest: Effort normal and breath sounds normal.  Abdominal: Soft.       No pulsating masses, no guarding, no tenderness  Musculoskeletal: He exhibits tenderness ( Local tenderness to the lumbar and paralumbar areas. Minimal spinal tenderness, no redness or swelling).       No spinal tenderness of the cervical, thoracic or lumbar spines  Neurological: He is alert.       Gait is normal, isolated strength of the bilateral lower extremities is normal, sensation normal, speech normal  Skin: Skin is warm and dry. No erythema.    ED Course  Procedures (including critical care time)  Labs Reviewed - No data to display No results found.   1. Chronic back pain   2. Chronic neck pain       MDM   The patient is afebrile, no tachycardia, neurologically at baseline with reproducible tenderness to palpation in a pain that is chronic. I doubt that this pain is related to a pathologic cause, the patient will be given intramuscular Toradol, intramuscular Decadron and discharged with meloxicam. He states that he R. he has oxycodone for which he takes for the pain with some relief.        Vida Roller, MD 05/26/12 [phone]

## 2012-05-26 NOTE — ED Notes (Signed)
[id number]<AV> Expected date:<BR> Expected time:<BR> Means of arrival:<BR> Comments:<BR> Hold per charge

## 2012-06-23 ENCOUNTER — Emergency Department (HOSPITAL_COMMUNITY)
Admission: EM | Admit: 2012-06-23 | Discharge: 2012-06-25 | Disposition: A | Discharge status: Home / Self Care | Payer: Medicaid Other | Attending: Emergency Medicine | Admitting: Emergency Medicine

## 2012-06-23 ENCOUNTER — Encounter (HOSPITAL_COMMUNITY): Payer: Self-pay | Admitting: Emergency Medicine

## 2012-06-23 DIAGNOSIS — F1910 Other psychoactive substance abuse, uncomplicated: Secondary | ICD-10-CM | POA: Insufficient documentation

## 2012-06-23 DIAGNOSIS — Z8679 Personal history of other diseases of the circulatory system: Secondary | ICD-10-CM | POA: Insufficient documentation

## 2012-06-23 DIAGNOSIS — R45851 Suicidal ideations: Secondary | ICD-10-CM | POA: Insufficient documentation

## 2012-06-23 DIAGNOSIS — F1020 Alcohol dependence, uncomplicated: Secondary | ICD-10-CM | POA: Insufficient documentation

## 2012-06-23 DIAGNOSIS — F17200 Nicotine dependence, unspecified, uncomplicated: Secondary | ICD-10-CM | POA: Insufficient documentation

## 2012-06-23 DIAGNOSIS — F32A Depression, unspecified: Secondary | ICD-10-CM

## 2012-06-23 DIAGNOSIS — M129 Arthropathy, unspecified: Secondary | ICD-10-CM | POA: Insufficient documentation

## 2012-06-23 DIAGNOSIS — Z79899 Other long term (current) drug therapy: Secondary | ICD-10-CM | POA: Insufficient documentation

## 2012-06-23 DIAGNOSIS — M549 Dorsalgia, unspecified: Secondary | ICD-10-CM | POA: Insufficient documentation

## 2012-06-23 DIAGNOSIS — F329 Major depressive disorder, single episode, unspecified: Secondary | ICD-10-CM

## 2012-06-23 DIAGNOSIS — G8929 Other chronic pain: Secondary | ICD-10-CM | POA: Insufficient documentation

## 2012-06-23 DIAGNOSIS — R4589 Other symptoms and signs involving emotional state: Secondary | ICD-10-CM

## 2012-06-23 DIAGNOSIS — F3289 Other specified depressive episodes: Secondary | ICD-10-CM | POA: Insufficient documentation

## 2012-06-23 DIAGNOSIS — F39 Unspecified mood [affective] disorder: Secondary | ICD-10-CM | POA: Insufficient documentation

## 2012-06-23 DIAGNOSIS — F411 Generalized anxiety disorder: Secondary | ICD-10-CM | POA: Insufficient documentation

## 2012-06-23 LAB — COMPREHENSIVE METABOLIC PANEL
Alkaline Phosphatase: 93 U/L (ref 39–117)
BUN: 20 mg/dL (ref 6–23)
Chloride: 100 mEq/L (ref 96–112)
Creatinine, Ser: 0.73 mg/dL (ref 0.50–1.35)
GFR calc Af Amer: >90 mL/min (ref >90)
Glucose, Bld: 105 mg/dL — ABNORMAL HIGH (ref 70–99)
Potassium: 3.5 mEq/L (ref 3.5–5.1)
Total Bilirubin: 0.3 mg/dL (ref 0.3–1.2)
Total Protein: 7.8 g/dL (ref 6.0–8.3)

## 2012-06-23 LAB — RAPID URINE DRUG SCREEN, HOSP PERFORMED: Barbiturates: NOT DETECTED

## 2012-06-23 LAB — CBC
HCT: 46.6 % (ref 39.0–52.0)
Hemoglobin: 16.2 g/dL (ref 13.0–17.0)
MCHC: 34.8 g/dL (ref 30.0–36.0)
MCV: 85.2 fL (ref 78.0–100.0)

## 2012-06-23 LAB — ETHANOL: Alcohol, Ethyl (B): <11 mg/dL (ref 0–11)

## 2012-06-23 LAB — ACETAMINOPHEN LEVEL: Acetaminophen (Tylenol), Serum: <15 ug/mL (ref 10–30)

## 2012-06-23 MED ORDER — ACETAMINOPHEN 325 MG PO TABS
650.0000 mg | ORAL_TABLET | ORAL | Status: DC | PRN
  Administered 2012-06-23 – 2012-06-25 (×4): 650 mg via ORAL
  Filled 2012-06-23 (×4): qty 2

## 2012-06-23 MED ORDER — LORAZEPAM 1 MG PO TABS
1.0000 mg | ORAL_TABLET | Freq: Three times a day (TID) | ORAL | Status: DC | PRN
  Administered 2012-06-23: 1 mg via ORAL
  Filled 2012-06-23: qty 1

## 2012-06-23 MED ORDER — IBUPROFEN 600 MG PO TABS
600.0000 mg | ORAL_TABLET | Freq: Three times a day (TID) | ORAL | Status: DC | PRN
  Administered 2012-06-23 – 2012-06-24 (×2): 600 mg via ORAL
  Filled 2012-06-23: qty 3
  Filled 2012-06-23: qty 1

## 2012-06-23 MED ORDER — LORAZEPAM 1 MG PO TABS
1.0000 mg | ORAL_TABLET | Freq: Four times a day (QID) | ORAL | Status: DC | PRN
  Administered 2012-06-23 – 2012-06-25 (×5): 1 mg via ORAL
  Filled 2012-06-23 (×5): qty 1

## 2012-06-23 MED ORDER — ONDANSETRON HCL 4 MG PO TABS: 4.0000 mg | ORAL_TABLET | Freq: Three times a day (TID) | ORAL | Status: DC | PRN

## 2012-06-23 MED ORDER — ZOLPIDEM TARTRATE 5 MG PO TABS
5.0000 mg | ORAL_TABLET | Freq: Every evening | ORAL | Status: DC | PRN
  Administered 2012-06-23 – 2012-06-24 (×2): 5 mg via ORAL
  Filled 2012-06-23 (×3): qty 1

## 2012-06-23 MED ORDER — NICOTINE 21 MG/24HR TD PT24
21.0000 mg | MEDICATED_PATCH | Freq: Every day | TRANSDERMAL | Status: DC
  Administered 2012-06-23 – 2012-06-25 (×3): 21 mg via TRANSDERMAL
  Filled 2012-06-23 (×3): qty 1

## 2012-06-23 NOTE — ED Notes (Signed)
Pt c/o chronic back pain, reports SI d/t pain.

## 2012-06-23 NOTE — ED Notes (Signed)
Pt moved to rm 29 . Sitter at bedside. Vwilliams,rn.

## 2012-06-23 NOTE — ED Notes (Signed)
Pt in via EMS from home. Pt reports that he has chronic back and neck pain x5 years. Pt reports that pain is so bad that " he cannot take it " and has SI. Pt reports that he has a plan to "just hang myself". Pt reports that he takes meds at home with no relief. Pt in NAD and A&O

## 2012-06-23 NOTE — ED Provider Notes (Signed)
History     CSN: [id number]  Arrival date & time 06/23/12  1206   First MD Initiated Contact with Patient 06/23/12 1303      Chief Complaint  Patient presents with  . Medical Clearance    (Consider location/radiation/quality/duration/timing/severity/associated sxs/prior treatment) Patient is a 52 y.o. male presenting with mental health disorder. The history is provided by the patient.  Mental Health Problem The primary symptoms include dysphoric mood.  Additional symptoms of the illness do not include no abdominal pain. Associated symptoms comments: He reports he is living with chronic back pain and that he is having depression and suicidal thoughts because he doesn't want to deal with the pain any more. .    Past Medical History  Diagnosis Date  . Chronic pain   . Polysubstance abuse   . Back pain   . Depression   . Alcoholism   . Anxiety   . Migraine   . Arthritis     DDD    Past Surgical History  Procedure Date  . Femur fracture surgery   . Fracture surgery   . Nasal septum surgery     Family History  Problem Relation Age of Onset  . Alcohol abuse Mother   . Alcohol abuse Father   . Heart disease Father     Pacemaker    History  Substance Use Topics  . Smoking status: Current Every Day Smoker -- 1.0 packs/day for 38 years    Types: Cigarettes  . Smokeless tobacco: Not on file  . Alcohol Use: No      Review of Systems  Constitutional: Negative for fever and chills.  Gastrointestinal: Negative for abdominal pain.  Musculoskeletal: Positive for back pain.       See HPI  Neurological: Negative.   Psychiatric/Behavioral: Positive for suicidal ideas and dysphoric mood.    Allergies  Metoclopramide  Home Medications   Current Outpatient Rx  Name  Route  Sig  Dispense  Refill  . LORAZEPAM 1 MG PO TABS   Oral   Take 1 tablet (1 mg total) by mouth 3 (three) times daily as needed for anxiety.   15 tablet   0   . MELOXICAM 7.5 MG PO TABS    Oral   Take 2 tablets (15 mg total) by mouth daily.   30 tablet   0   . OXYCODONE HCL 10 MG PO TABS   Oral   Take 10 mg by mouth every 6 (six) hours as needed. pain           BP 143/97  Pulse 83  Temp 98.1 F (36.7 C) (Oral)  Resp 16  SpO2 99%  Physical Exam  Constitutional: He is oriented to person, place, and time. He appears well-developed and well-nourished.  HENT:  Head: Normocephalic.  Neck: Normal range of motion. Neck supple.  Cardiovascular: Normal rate and regular rhythm.   Pulmonary/Chest: Effort normal and breath sounds normal.  Abdominal: Soft. Bowel sounds are normal. There is no tenderness. There is no rebound and no guarding.  Musculoskeletal: Normal range of motion.  Neurological: He is alert and oriented to person, place, and time.  Skin: Skin is warm and dry. No rash noted.  Psychiatric: He has a normal mood and affect.    ED Course  Procedures (including critical care time)  Labs Reviewed  CBC - Abnormal; Notable for the following:    WBC 12.6 (*)     Platelets 407 (*)     All other  components within normal limits  ACETAMINOPHEN LEVEL  COMPREHENSIVE METABOLIC PANEL  ETHANOL  SALICYLATE LEVEL  URINE RAPID DRUG SCREEN (HOSP PERFORMED)   No results found.   No diagnosis found.  1. Suicidal ideation 2. Chronic back pain  MDM  Discussed that pain is managed in the ED with tylenol and ibuprofen, which he agrees to. Will medically clear for [facility] evaluation.        Arnoldo Hooker, PA-C 06/23/12 1355

## 2012-06-23 NOTE — ED Notes (Signed)
Pt transferred to rm 32. Pt reports severe pain of 10/10 at this time. Requesting a nicotine patch and some pain med. Lying in bed with eyes opened. Vwilliams,rn.

## 2012-06-23 NOTE — ED Provider Notes (Signed)
Medical screening examination/treatment/procedure(s) were performed by non-physician practitioner and as supervising physician I was immediately available for consultation/collaboration.   Gwyneth Sprout, MD 06/23/12 [phone]
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
## 2012-06-23 NOTE — ED Notes (Signed)
Bed:[facility]<BR> Expected date:<BR> Expected time:<BR> Means of arrival:<BR> Comments:<BR> EMS

## 2012-06-24 NOTE — Progress Notes (Signed)
CSW referred patient to [facility], and [facility]. Pt under review at both facilities. CSW attempted to refer patient to [facility] however no beds available.   Catha Gosselin, LCSWA  [phone] .06/24/2012 20:21pm

## 2012-06-24 NOTE — Progress Notes (Signed)
52 year old male medicaid [place] access pt,  pcp merrell, Jared [name] , came in [place] ED 06/23/12 for c/o  chronic back & neck pain for 5 years causing SI "just hang myself" further pmh polysubstance abuse, depression, migraine, anxiety arithritis All labs wnl except wbc 12.6 plt 407 drug screen negative Given a sitter , Ativan, nicoderm pathc prn tyleonol, ibuprofen ACT saw pt 06/23/12 dispositoin Adult Inpatient program

## 2012-06-24 NOTE — ED Notes (Signed)
Patient was given a sandwich and a cheese stick

## 2012-06-24 NOTE — ED Notes (Signed)
Pt with chronic pain , SI transferred from TCU with plan to hang himself.  Denies SI at present.  Flat affect noted.

## 2012-06-25 NOTE — BHH Counselor (Signed)
PT ACCEPTED TO [facility] BY DR. Prince Solian, CALL REPORT [phone]; BED #2590-2. *Patient waiting on transport to the facility. Patient agreed to voluntarily go to [facility] and stated that he would sign himself into the facility. Patient stating, "I need help". Writer contacted [facility] and spoke to April about patients voluntary admission. She stated that this would be fine as long as he signed the voluntary form upon arrival. Patient made aware of the voluntary form and agreed to sign himself into the facility. Patients nurse-Laquita will call report and make the appropriate transportation arrangements. EDP-Allen notified of patients acceptance and agrees to discharge patient for transport to [facility].

## 2012-06-25 NOTE — ED Notes (Signed)
Patient complains of increase anxiety and needed something to help him sleep.

## 2012-08-12 ENCOUNTER — Ambulatory Visit (INDEPENDENT_AMBULATORY_CARE_PROVIDER_SITE_OTHER): Payer: Medicaid Other | Admitting: Family Medicine

## 2012-08-12 VITALS — BP 121/78 | HR 85 | Temp 98.2°F | Ht 66.0 in | Wt 171.5 lb

## 2012-08-12 DIAGNOSIS — F1910 Other psychoactive substance abuse, uncomplicated: Secondary | ICD-10-CM

## 2012-08-12 DIAGNOSIS — F411 Generalized anxiety disorder: Secondary | ICD-10-CM

## 2012-08-12 DIAGNOSIS — F419 Anxiety disorder, unspecified: Secondary | ICD-10-CM

## 2012-08-12 DIAGNOSIS — G8929 Other chronic pain: Secondary | ICD-10-CM

## 2012-08-12 DIAGNOSIS — IMO0001 Reserved for inherently not codable concepts without codable children: Secondary | ICD-10-CM

## 2012-08-12 NOTE — Progress Notes (Signed)
Jared [name] is a 52 y.o. male who presents to [facility] today for initial evaluation and establishment of care.   Pt here for pain management for chronic pain secondary to multiple fractures in limbs and back. [facility] have all stated that pt not an operable candidate secondary to how extensive his problems are. Stopped going to Dr. Ginette Otto after he was told that he cannot have anyting stronger than Oxy 10. Pt states that it doesn't work and he needs something more.  PT TOLD WE WILL NOT RX PAIN/BENZO MEDICATIONS.  Pt wants referral to pain clinic.   Pt states that he has damaged his brian and can't think straight after huffing lots of glue over his life and doing drugs. Unable to clearly state when the last time he did drugs but states that he likely has done so in the last month.   The following portions of the patient's history were reviewed and updated as appropriate: allergies, current medications, past medical history, family and social history, and problem list.  Patient is a smoker  Past Medical History  Diagnosis Date  . Chronic pain   . Polysubstance abuse   . Back pain   . Depression   . Alcoholism   . Anxiety   . Migraine   . Arthritis     DDD    ROS as above otherwise neg.    Medications reviewed. No current outpatient prescriptions on file.   No current facility-administered medications for this visit.    Exam:  BP 121/78  Pulse 85  Temp(Src) 98.2 F (36.8 C) (Oral)  Ht 5\' 6"  (1.676 m)  Wt 171 lb 8 oz (77.792 kg)  BMI 27.69 kg/m2  SpO2 96% Gen: Well NAD HEENT: EOMI,  MMM Lungs: Nl WOB Neuro: CN grossly intact.  Psych: pt w/ tangential thinking at times and sometimes osunding overly anxious.  No results found for this or any previous visit (from the past 72 hour(s)).   Spent greater than counseling w/ pt and determining treatment plan

## 2012-08-12 NOTE — Patient Instructions (Addendum)
Thank you for coming into clinic today The pain clinic will call you for an appointment They will treat all of your pain complaints.  Please come back in 4 weeks to discuss your overall health and other concerns

## 2012-08-13 ENCOUNTER — Encounter: Payer: Self-pay | Admitting: Family Medicine

## 2012-08-13 DIAGNOSIS — F1910 Other psychoactive substance abuse, uncomplicated: Secondary | ICD-10-CM | POA: Insufficient documentation

## 2012-08-13 HISTORY — DX: Other psychoactive substance abuse, uncomplicated: F19.10

## 2012-08-13 NOTE — Assessment & Plan Note (Signed)
Pt referred to pain mgt. Will not Rx pain medications.  F/u PRN.

## 2012-08-13 NOTE — Assessment & Plan Note (Signed)
Will address further at future appts.  Much of his anxiety is realted to his drug seeking habits, opioid overuse, and withdrawal, and neurological damage from drug abuse
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
## 2012-09-10 ENCOUNTER — Encounter: Payer: Self-pay | Admitting: Family Medicine

## 2012-09-10 ENCOUNTER — Ambulatory Visit (INDEPENDENT_AMBULATORY_CARE_PROVIDER_SITE_OTHER): Payer: Medicaid Other | Admitting: Family Medicine

## 2012-09-10 VITALS — BP 144/85 | HR 65 | Ht 66.0 in | Wt 165.0 lb

## 2012-09-10 DIAGNOSIS — G8929 Other chronic pain: Secondary | ICD-10-CM

## 2012-09-10 DIAGNOSIS — IMO0001 Reserved for inherently not codable concepts without codable children: Secondary | ICD-10-CM

## 2012-09-10 DIAGNOSIS — E785 Hyperlipidemia, unspecified: Secondary | ICD-10-CM

## 2012-09-10 NOTE — Assessment & Plan Note (Addendum)
Pt has been denied by [facility] pain mgt clinics Staff putting out addition referral requests Continue w/ NSAIDs and tylenol PRN

## 2012-09-10 NOTE — Assessment & Plan Note (Signed)
Currently drug free for the past 30 days Pt advised to continue to stay drug free

## 2012-09-10 NOTE — Assessment & Plan Note (Signed)
Family history of MI and previous elevated lipids in a 52yo smoker Lipid panel, and CMET

## 2012-09-10 NOTE — Patient Instructions (Addendum)
Thank you for coming into clinic today Please come back to the lab at your convenience fasting to have your blood work done I will let you know if your results are abnormal We will continue to work to get you into a pain manangement clinic Please take a baby aspirin daily Please use the [place] Quitline to help with your smoking (CastForum.de) Please get yourself scheduled for a colonoscopy You may use tylenol and Ibuprofen or advil for your pain  Have a great day.

## 2012-09-10 NOTE — Assessment & Plan Note (Signed)
GAD 7 score of 9 Pain is pts greatest trigger.  Discussed starting antidepressents in the future but feel pt needs to get his pain under control

## 2012-09-10 NOTE — Progress Notes (Signed)
Jared [name] is a 52 y.o. male who presents to [facility] today for   Pain: continues to be in pain. In the past month pt has sought [facility] ED for pain meds. Has been using fentanyl patches and suboxone. Fentanyl w/o much benefit, but great relief w/ suboxone. Also given xanax for anxiety w/ relief.   Denies CP, n/v/d/c, SOB.  Tobacco: 1ppd still. Has never tried quitting. Feels like needs pain controlled prior to quitting.   Anxiety: triggers include pain, and stress. Uses Xanax .5mg  60 per month w/ relief.   The following portions of the patient's history were reviewed and updated as appropriate: allergies, current medications, past medical history, family and social history, and problem list.  Patient is a smoker.   Past Medical History  Diagnosis Date  . Chronic pain   . Polysubstance abuse   . Back pain   . Depression   . Alcoholism   . Anxiety   . Migraine   . Arthritis     DDD  . Drug abuse 08/13/2012    ROS as above otherwise neg.    Medications reviewed. No current outpatient prescriptions on file.   No current facility-administered medications for this visit.    Exam: BP 144/85  Pulse 65  Ht 5\' 6"  (1.676 m)  Wt 165 lb (74.844 kg)  BMI 26.64 kg/m2 Gen: Well NAD HEENT: EOMI,  MMM Lungs:  Nl WOB   No results found for this or any previous visit (from the past 72 hour(s)).

## 2012-09-20 ENCOUNTER — Other Ambulatory Visit: Payer: Self-pay

## 2012-10-03 ENCOUNTER — Telehealth: Payer: Self-pay | Admitting: Family Medicine

## 2012-10-03 NOTE — Telephone Encounter (Signed)
Jared [name],  Unfortunately I don't have any further resources to recommend to this patient as we will not Rx any pain medications or benzos. I am more than willing to continue to treat his other medical conditions but he will need a separate physician for his pain medications if we cannot find a pain clinic that will take him on.

## 2012-10-03 NOTE — Telephone Encounter (Signed)
Patient is calling because he hasn't heard back anything on his pain management referral that was placed back in March.  He doesn't have any minutes on his phone so he will call back from a friends phone so any messages that can be left for him need to be entered and will be relayed back to him that way.

## 2012-10-03 NOTE — Telephone Encounter (Signed)
Unable to reach pt.  Pt has been referred to 3 different clinics for pain management and they were not willing to take his case on.  Waiting for a response from doctor on where to go from here. Please inform pt that we will get back to him as soon as we hear something from Dr. Konrad Dolores.

## 2012-10-07 NOTE — Telephone Encounter (Signed)
Tried calling pt again and still not able to reach him.  Please inform of message from PCP if he returns call.

## 2012-10-27 ENCOUNTER — Encounter (HOSPITAL_COMMUNITY): Payer: Self-pay | Admitting: *Deleted

## 2012-10-27 ENCOUNTER — Emergency Department (HOSPITAL_COMMUNITY)
Admission: EM | Admit: 2012-10-27 | Discharge: 2012-10-28 | Disposition: A | Discharge status: Other Acute Inpatient Hospital | Payer: Medicaid Other | Attending: Emergency Medicine | Admitting: Emergency Medicine

## 2012-10-27 DIAGNOSIS — Z79899 Other long term (current) drug therapy: Secondary | ICD-10-CM | POA: Insufficient documentation

## 2012-10-27 DIAGNOSIS — G8929 Other chronic pain: Secondary | ICD-10-CM | POA: Insufficient documentation

## 2012-10-27 DIAGNOSIS — R112 Nausea with vomiting, unspecified: Secondary | ICD-10-CM | POA: Insufficient documentation

## 2012-10-27 DIAGNOSIS — F1021 Alcohol dependence, in remission: Secondary | ICD-10-CM | POA: Insufficient documentation

## 2012-10-27 DIAGNOSIS — H53149 Visual discomfort, unspecified: Secondary | ICD-10-CM | POA: Insufficient documentation

## 2012-10-27 DIAGNOSIS — F3289 Other specified depressive episodes: Secondary | ICD-10-CM | POA: Insufficient documentation

## 2012-10-27 DIAGNOSIS — F411 Generalized anxiety disorder: Secondary | ICD-10-CM | POA: Insufficient documentation

## 2012-10-27 DIAGNOSIS — F17200 Nicotine dependence, unspecified, uncomplicated: Secondary | ICD-10-CM | POA: Insufficient documentation

## 2012-10-27 DIAGNOSIS — F329 Major depressive disorder, single episode, unspecified: Secondary | ICD-10-CM | POA: Insufficient documentation

## 2012-10-27 DIAGNOSIS — R45851 Suicidal ideations: Secondary | ICD-10-CM | POA: Insufficient documentation

## 2012-10-27 DIAGNOSIS — Z7982 Long term (current) use of aspirin: Secondary | ICD-10-CM | POA: Insufficient documentation

## 2012-10-27 DIAGNOSIS — F1910 Other psychoactive substance abuse, uncomplicated: Secondary | ICD-10-CM | POA: Insufficient documentation

## 2012-10-27 DIAGNOSIS — Z8739 Personal history of other diseases of the musculoskeletal system and connective tissue: Secondary | ICD-10-CM | POA: Insufficient documentation

## 2012-10-27 DIAGNOSIS — G43909 Migraine, unspecified, not intractable, without status migrainosus: Secondary | ICD-10-CM | POA: Insufficient documentation

## 2012-10-27 NOTE — ED Notes (Addendum)
C/o HA, chronic head, neck & back pain, also nausea and feeling suicidal. States, "I need psychiatric help". Took methadone earlier today w/o relief. Admits to 3 beers, last ETOH 1700. Admits to recent meal at 1700. C/c is HA and nausea. Denies drug use or hallucinations. "waiting to get on suboxone". Pt brought straight back to room FT 8, security present to wand pt. Pt calm, NAD, alert, interactive, cooperative. Getting undressed into blue paper scrubs. Has small tan canvas Furniture conservator/restorer.  AC notified for sitter need. CN made aware.

## 2012-10-28 ENCOUNTER — Inpatient Hospital Stay (HOSPITAL_COMMUNITY)
Admission: AD | Admit: 2012-10-28 | Discharge: 2012-11-01 | DRG: 885 | Disposition: A | Discharge status: Home / Self Care | Payer: Medicaid Other | Source: Ambulatory Visit | Attending: Psychiatry | Admitting: Psychiatry

## 2012-10-28 ENCOUNTER — Encounter (HOSPITAL_COMMUNITY): Payer: Self-pay | Admitting: *Deleted

## 2012-10-28 DIAGNOSIS — M549 Dorsalgia, unspecified: Secondary | ICD-10-CM | POA: Diagnosis present

## 2012-10-28 DIAGNOSIS — F1910 Other psychoactive substance abuse, uncomplicated: Secondary | ICD-10-CM

## 2012-10-28 DIAGNOSIS — E785 Hyperlipidemia, unspecified: Secondary | ICD-10-CM

## 2012-10-28 DIAGNOSIS — R45851 Suicidal ideations: Secondary | ICD-10-CM

## 2012-10-28 DIAGNOSIS — F419 Anxiety disorder, unspecified: Secondary | ICD-10-CM

## 2012-10-28 DIAGNOSIS — F331 Major depressive disorder, recurrent, moderate: Principal | ICD-10-CM | POA: Diagnosis present

## 2012-10-28 DIAGNOSIS — Z79899 Other long term (current) drug therapy: Secondary | ICD-10-CM

## 2012-10-28 DIAGNOSIS — G8929 Other chronic pain: Secondary | ICD-10-CM | POA: Diagnosis present

## 2012-10-28 LAB — BASIC METABOLIC PANEL
BUN: 12 mg/dL (ref 6–23)
CO2: 29 mEq/L (ref 19–32)
Calcium: 9.4 mg/dL (ref 8.4–10.5)
Creatinine, Ser: 0.9 mg/dL (ref 0.50–1.35)
GFR calc non Af Amer: >90 mL/min (ref >90)
Glucose, Bld: 120 mg/dL — ABNORMAL HIGH (ref 70–99)

## 2012-10-28 LAB — CBC WITH DIFFERENTIAL/PLATELET
Eosinophils Absolute: 0.1 10*3/uL (ref 0.0–0.7)
Eosinophils Relative: 1 % (ref 0–5)
HCT: 42.4 % (ref 39.0–52.0)
Lymphocytes Relative: 22 % (ref 12–46)
Lymphs Abs: 2.3 10*3/uL (ref 0.7–4.0)
MCH: 29.7 pg (ref 26.0–34.0)
MCV: 85.1 fL (ref 78.0–100.0)
Monocytes Absolute: 0.4 10*3/uL (ref 0.1–1.0)
RBC: 4.98 MIL/uL (ref 4.22–5.81)
WBC: 10.4 10*3/uL (ref 4.0–10.5)

## 2012-10-28 LAB — RAPID URINE DRUG SCREEN, HOSP PERFORMED
Amphetamines: NOT DETECTED
Barbiturates: NOT DETECTED
Tetrahydrocannabinol: NOT DETECTED

## 2012-10-28 MED ORDER — TRAZODONE HCL 50 MG PO TABS
50.0000 mg | ORAL_TABLET | Freq: Every evening | ORAL | Status: DC | PRN
  Administered 2012-10-28 – 2012-10-31 (×4): 50 mg via ORAL
  Filled 2012-10-28: qty 1
  Filled 2012-10-28: qty 6
  Filled 2012-10-28 (×3): qty 1

## 2012-10-28 MED ORDER — LORAZEPAM 1 MG PO TABS
1.0000 mg | ORAL_TABLET | Freq: Three times a day (TID) | ORAL | Status: DC | PRN
  Administered 2012-10-28: 1 mg via ORAL
  Filled 2012-10-28: qty 1

## 2012-10-28 MED ORDER — ONDANSETRON HCL 4 MG PO TABS: 4.0000 mg | ORAL_TABLET | Freq: Three times a day (TID) | ORAL | Status: DC | PRN

## 2012-10-28 MED ORDER — PNEUMOCOCCAL VAC POLYVALENT 25 MCG/0.5ML IJ INJ
0.5000 mL | INJECTION | INTRAMUSCULAR | Status: AC
  Administered 2012-10-29: 0.5 mL via INTRAMUSCULAR

## 2012-10-28 MED ORDER — NICOTINE 21 MG/24HR TD PT24
21.0000 mg | MEDICATED_PATCH | Freq: Every day | TRANSDERMAL | Status: DC
  Administered 2012-10-28: 21 mg via TRANSDERMAL
  Filled 2012-10-28 (×2): qty 1

## 2012-10-28 MED ORDER — KETOROLAC TROMETHAMINE 60 MG/2ML IM SOLN
60.0000 mg | Freq: Once | INTRAMUSCULAR | Status: AC
  Administered 2012-10-28: 60 mg via INTRAMUSCULAR
  Filled 2012-10-28: qty 2

## 2012-10-28 MED ORDER — ACETAMINOPHEN 325 MG PO TABS: 650.0000 mg | ORAL_TABLET | ORAL | Status: DC | PRN

## 2012-10-28 MED ORDER — ALUM & MAG HYDROXIDE-SIMETH 200-200-20 MG/5ML PO SUSP: 30.0000 mL | ORAL | Status: DC | PRN

## 2012-10-28 MED ORDER — ACETAMINOPHEN 325 MG PO TABS
650.0000 mg | ORAL_TABLET | Freq: Four times a day (QID) | ORAL | Status: DC | PRN
  Administered 2012-10-29 – 2012-10-30 (×4): 650 mg via ORAL

## 2012-10-28 MED ORDER — HYDROXYZINE HCL 25 MG PO TABS
25.0000 mg | ORAL_TABLET | Freq: Four times a day (QID) | ORAL | Status: DC | PRN
  Administered 2012-10-29 – 2012-11-01 (×9): 25 mg via ORAL
  Filled 2012-10-28: qty 1

## 2012-10-28 MED ORDER — PROMETHAZINE HCL 25 MG/ML IJ SOLN
25.0000 mg | Freq: Once | INTRAMUSCULAR | Status: AC
  Administered 2012-10-28: 25 mg via INTRAMUSCULAR
  Filled 2012-10-28: qty 1

## 2012-10-28 MED ORDER — MAGNESIUM HYDROXIDE 400 MG/5ML PO SUSP: 30.0000 mL | Freq: Every day | ORAL | Status: DC | PRN

## 2012-10-28 MED ORDER — NAPROXEN 500 MG PO TABS
500.0000 mg | ORAL_TABLET | Freq: Two times a day (BID) | ORAL | Status: DC
  Administered 2012-10-28 – 2012-10-30 (×4): 500 mg via ORAL
  Filled 2012-10-28 (×10): qty 1

## 2012-10-28 MED ORDER — CHLORDIAZEPOXIDE HCL 25 MG PO CAPS
25.0000 mg | ORAL_CAPSULE | Freq: Four times a day (QID) | ORAL | Status: AC | PRN
  Administered 2012-10-28 – 2012-10-29 (×2): 25 mg via ORAL
  Filled 2012-10-28 (×2): qty 1

## 2012-10-28 MED ORDER — HYDROCODONE-ACETAMINOPHEN 5-325 MG PO TABS
1.0000 | ORAL_TABLET | ORAL | Status: DC | PRN
  Administered 2012-10-28 (×2): 1 via ORAL
  Filled 2012-10-28 (×2): qty 1

## 2012-10-28 NOTE — Progress Notes (Signed)
Patient has been talking, laughing with peers on hall.  Went to dining room for dinner.
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
## 2012-10-28 NOTE — ED Provider Notes (Signed)
Assuming care of patient this morning. Patient in the ED for suicidal ideation.  Workup thus far is negative - pt accepted to Behavioral health.  Filed Vitals:   10/28/12 0542  BP: 123/77  Pulse: 62  Temp: 97.5 F (36.4 C)  Resp: 18    Lyda Colcord, MD 10/28/12 1128

## 2012-10-28 NOTE — ED Notes (Addendum)
Upon entering pt room he asks "have you been saved". Pt continues to focus conversation on religious beliefs. Pt states the only thing stopping him from committing suicide is that he would not receive his reward from the lord. Pt states his chronic neck and back pain is his main concern. It is causing him to be depressed and when he is unable to get pain medicine he feels that he can't live with the pain and he is here to get help for that. Pt states he was prescribed pain medicine that did not help and his friend gave him the same drug with a higher dose and he felt like he was 19 again and could live a normal life.

## 2012-10-28 NOTE — Tx Team (Signed)
Initial Interdisciplinary Treatment Plan  PATIENT STRENGTHS: (choose at least two) Ability for insight Average or above average intelligence Capable of independent living Motivation for treatment/growth  PATIENT STRESSORS: Health problems Substance abuse   PROBLEM LIST: Problem List/Patient Goals Date to be addressed Date deferred Reason deferred Estimated date of resolution  Depression 10/28/12     Substance Abuse 10/28/12                                                DISCHARGE CRITERIA:  Ability to meet basic life and health needs Improved stabilization in mood, thinking, and/or behavior Verbal commitment to aftercare and medication compliance  PRELIMINARY DISCHARGE PLAN: Attend aftercare/continuing care group Return to previous living arrangement  PATIENT/FAMIILY INVOLVEMENT: This treatment plan has been presented to and reviewed with the patient, Jared [name], and/or family member, .  The patient and family have been given the opportunity to ask questions and make suggestions.  Jared [name], [place] 10/28/2012, 5:09 PM

## 2012-10-28 NOTE — ED Notes (Signed)
Vicodin and Ativan given for c/o chronic pain in back and neck- expresses interest in finding a pain management clinic that will give him suboxone instead of narcotics. States has used some of friend's Suboxone and felt fine, but this weather change has caused more pain and made pt depressed

## 2012-10-28 NOTE — BH Assessment (Signed)
Assessment Note  Update:  Received call from [facility] stating pt accepted by Fransisca Kaufmann, NP to Dr. Daleen Bo to bed 506-1 and that pt could be transported to [facility].  Updated assessment disposition and completed support paperwork and faxed to [facility].  Updated EDP Nanavati and ED staff.  ED staff to arrange transport to [facility] as pt is voluntary.  Pt signed an agreement stating he would not receive narcotics while at [facility].  Faxed to [facility].    Disposition:  Disposition Initial Assessment Completed for this Encounter: Yes Disposition of Patient: Inpatient treatment program Type of inpatient treatment program: Adult Patient referred to: Other (Comment) (Pt accepted [facility])  On Site Evaluation by:   Reviewed with Physician:  Argie Ramming 10/28/2012 11:29 AM
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
## 2012-10-28 NOTE — ED Provider Notes (Signed)
History/physical exam/procedure(s) were performed by non-physician practitioner and as supervising physician I was immediately available for consultation/collaboration. I have reviewed all notes and am in agreement with care and plan.   Hilario Quarry, MD 10/28/12 [phone]

## 2012-10-28 NOTE — ED Provider Notes (Signed)
History     CSN: [id number]  Arrival date & time 10/27/12  2342   First MD Initiated Contact with Patient 10/28/12 0001      Chief Complaint  Patient presents with  . Suicidal  . Pain  . Nausea  . Headache    (Consider location/radiation/quality/duration/timing/severity/associated sxs/prior treatment) HPI History provided by pt.   Pt presents w/ multiple complaints.  He is suicidal.  Has thought about hanging/shooting himself or running out into traffic.  Prior h/o suicide attempt.  Denies HI.  Abuses cocaine and THC and occasionally drinks alcohol. Attributes SI to severe, chronic pain of neck/back and neuropathy.  Pain typical and denies recent trauma.  Also c/o typical migraine headache.  Pain frontal, non-radiating, associated w/ photophobia and N/V.  Has not taken anything for pain. Denies trauma.   Past Medical History  Diagnosis Date  . Chronic pain   . Polysubstance abuse   . Back pain   . Depression   . Alcoholism   . Anxiety   . Migraine   . Arthritis     DDD  . Drug abuse 08/13/2012    Past Surgical History  Procedure Laterality Date  . Femur fracture surgery    . Fracture surgery    . Nasal septum surgery      Family History  Problem Relation Age of Onset  . Alcohol abuse Mother   . Alcohol abuse Father   . Heart disease Father     Pacemaker    History  Substance Use Topics  . Smoking status: Current Every Day Smoker -- 1.00 packs/day for 38 years    Types: Cigarettes  . Smokeless tobacco: Not on file  . Alcohol Use: Yes      Review of Systems  All other systems reviewed and are negative.    Allergies  Metoclopramide  Home Medications   Current Outpatient Rx  Name  Route  Sig  Dispense  Refill  . ALPRAZolam (XANAX PO)   Oral   Take 1 tablet by mouth every 6 (six) hours as needed (anxiety).         Marland Kitchen aspirin 81 MG tablet   Oral   Take 81 mg by mouth daily as needed for pain.          . ClonazePAM (KLONOPIN PO)   Oral   Take  1 tablet by mouth every 6 (six) hours as needed (anxiety).         . METHADONE HCL PO   Oral   Take by mouth.           BP 126/79  Temp(Src) 98.1 F (36.7 C) (Oral)  Resp 18  SpO2 98%  Physical Exam  Nursing note and vitals reviewed. Constitutional: He is oriented to person, place, and time. He appears well-developed and well-nourished. No distress.  HENT:  Head: Normocephalic and atraumatic.  Eyes:  Normal appearance.  No obvious photophobia  Neck: Normal range of motion.  Pt will not flex his neck d/t chronic pain but negative kernig's sign.   Cardiovascular: Normal rate, regular rhythm and intact distal pulses.   Pulmonary/Chest: Effort normal and breath sounds normal.  Musculoskeletal: Normal range of motion.  Neurological: He is alert and oriented to person, place, and time. No sensory deficit. Coordination normal.  CN 3-12 intact.  No nystagmus. 5/5 and equal upper and lower extremity strength.  No past pointing.     Skin: Skin is warm and dry. No rash noted.  Psychiatric: He has  a normal mood and affect. His behavior is normal.    ED Course  Procedures (including critical care time)  Labs Reviewed  BASIC METABOLIC PANEL - Abnormal; Notable for the following:    Glucose, Bld 120 (*)    All other components within normal limits  URINE RAPID DRUG SCREEN (HOSP PERFORMED) - Abnormal; Notable for the following:    Benzodiazepines POSITIVE (*)    All other components within normal limits  CBC WITH DIFFERENTIAL  ETHANOL   No results found.   1. Suicidal ideation   2. Polysubstance abuse   3. Migraine   4. Chronic pain       MDM  52yo M presents w/ SI secondary to severe chronic neck/back/neuropathic pain.  Has a plan.  Polysubstance abuser.  Pt also c/o typical migraine headache.  Reports no relief w/ IM toradol, yet, when I go to check on patient, he is sitting up in bed, talking to a technician, and does not appear to be at all uncomfortable.  He is  afebrile, non-toxic appearing, no focal neuro deficits, neg kernig's sign).  He is medically cleared.  Psych holding orders written and ACT team consulted.   IM phenergan ordered.  3:35 AM         Otilio Miu, PA-C 10/28/12 [phone]

## 2012-10-28 NOTE — BH Assessment (Signed)
[facility] Assessment Progress Note      Consulted with Fransisca Kaufmann NP re admission for this [facility] patient to come to [facility]. Accepted by Vernona Rieger for the 500 hall bed [phone]

## 2012-10-28 NOTE — ED Notes (Signed)
Report given to Shelda Jakes, RN at [facility]- request wait 30 minutes prior to sending patient.

## 2012-10-29 ENCOUNTER — Encounter (HOSPITAL_COMMUNITY): Payer: Self-pay | Admitting: Psychiatry

## 2012-10-29 DIAGNOSIS — F332 Major depressive disorder, recurrent severe without psychotic features: Secondary | ICD-10-CM

## 2012-10-29 DIAGNOSIS — F331 Major depressive disorder, recurrent, moderate: Principal | ICD-10-CM | POA: Diagnosis present

## 2012-10-29 DIAGNOSIS — F1994 Other psychoactive substance use, unspecified with psychoactive substance-induced mood disorder: Secondary | ICD-10-CM

## 2012-10-29 DIAGNOSIS — F1910 Other psychoactive substance abuse, uncomplicated: Secondary | ICD-10-CM

## 2012-10-29 DIAGNOSIS — F411 Generalized anxiety disorder: Secondary | ICD-10-CM

## 2012-10-29 MED ORDER — FLUOXETINE HCL 10 MG PO CAPS
10.0000 mg | ORAL_CAPSULE | Freq: Every day | ORAL | Status: DC
  Administered 2012-10-29 – 2012-11-01 (×4): 10 mg via ORAL
  Filled 2012-10-29 (×2): qty 1
  Filled 2012-10-29: qty 3
  Filled 2012-10-29 (×3): qty 1

## 2012-10-29 NOTE — Progress Notes (Addendum)
D:  Patient's self inventory sheet, patient sleeps well, has good appetite, normal energy level, good attention span.  Rated depression and hopelessness #3, anxiety #7.  Has been agitated in past 24 hours.  SI off/on, contracts for safety.  Has chronic pain.  Worst pain #6.  After discharge, "try to not do anything to hurt me.  I need help to get a suboxen doctor or pain management.  I need something that stops chronic pain.  The meds I get here don't work stopping pain."   A:  Medications administered per MD orders.  Emotional support and encouragement given to patient. R:  Denied HI.  Denied A/V hallucinations.  SI off/on, contracts for safety.  Will continue to monitor for safety with 15 minute checks.  Safety maintained. Patient would like to have neurotin, pain patch, will discuss with MD.
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
## 2012-10-29 NOTE — Progress Notes (Signed)
Adult Psychoeducational Group Note  Date:  10/29/2012 Time:  10:23 PM  Group Topic/Focus:  Wrap-Up Group:   The focus of this group is to help patients review their daily goal of treatment and discuss progress on daily workbooks.  Participation Level:  Active  Participation Quality:  Appropriate  Affect:  Appropriate  Cognitive:  Appropriate  Insight: Appropriate  Engagement in Group:  Developing/Improving  Modes of Intervention:  Exploration and Support  Additional Comments:  Pt stated that one positive is that he was able to talk to the dr after the meeting. Pt stated that one coping skill for him is to continue to help children.   Finnegan Gatta, Randal Buba 10/29/2012, 10:23 PM

## 2012-10-29 NOTE — [facility] Group Notes (Signed)
[facility] LCSW Group Therapy      Feelings About Diagnosis 1:15 - 2:30 PM         10/29/2012 4:13 PM  Type of Therapy:  Group Therapy  Participation Level:  Patient entered group late just prior to session ending.  Wynn Banker 10/29/2012, 4:13 PM

## 2012-10-29 NOTE — [facility] Suicide Risk Assessment (Signed)
Suicide Risk Assessment  Admission Assessment     Nursing information obtained from:  Patient Demographic factors:  Male;Caucasian;Low socioeconomic status;Living alone;Unemployed Current Mental Status:  Self-harm thoughts Loss Factors:  Decline in physical health Historical Factors:  Family history of mental illness or substance abuse Risk Reduction Factors:  Sense of responsibility to family;Positive social support  CLINICAL FACTORS:   Depression:   Anhedonia Hopelessness Impulsivity Severe Alcohol/Substance Abuse/Dependencies  COGNITIVE FEATURES THAT CONTRIBUTE TO RISK:  Closed-mindedness Thought constriction (tunnel vision)    SUICIDE RISK:   Mild:  Suicidal ideation of limited frequency, intensity, duration, and specificity.  There are no identifiable plans, no associated intent, mild dysphoria and related symptoms, good self-control (both objective and subjective assessment), few other risk factors, and identifiable protective factors, including available and accessible social support.  PLAN OF CARE: Start medications as needed. Provide supportive counselling and education.  I certify that inpatient services furnished can reasonably be expected to improve the patient's condition.  Jared [name] 10/29/2012, 1:45 PM

## 2012-10-29 NOTE — Progress Notes (Signed)
Grief & Loss Group  The group discussed significant losses in their lives. Group members processed feelings related to grief and loss. Group members discussed self-love and respect for themselves, and the struggles and difficulties it took for them to get there.    Pt was the first one to speak up in group and talked about the loss of his job after 25 years and how it was difficult for him to get a job now because employers want to higher younger people and because of the competitive job market. He moved on to giving advice to younger people in the group about resiliency factors like the importance of time for healing and the need for self love. He offered a lot of perspectives and examples to help others.  Sherol Dade Counselor Intern Haroldine Laws

## 2012-10-29 NOTE — Progress Notes (Signed)
D:Patient in day room interacting well with peers at the beginning of this shift. His mood and affect appropriate. He reported a better day today and that his pain was well maintained, although he requested for Tylenol during HS med pass. He denied SI/HI and denied hallucinations. He attended group and more visible on the unit tonight. A: Writer encouraged and supported patient. Offered Tylenol 650 mg for his mild pain. R: Patient receptive to encouragement and support. Q 15 minute check continues as ordered to maintain safety.

## 2012-10-29 NOTE — Progress Notes (Signed)
Adult Psychoeducational Group Note  Date:  10/29/2012 Time:  7:11 PM  Group Topic/Focus:  Recovery Goals:   The focus of this group is to identify appropriate goals for recovery and establish a plan to achieve them.  Participation Level:  Active  Participation Quality:  Appropriate and Attentive  Affect:  Appropriate  Cognitive:  Appropriate  Insight: Appropriate and Good  Engagement in Group:  Engaged  Modes of Intervention:  Discussion, Education, Limit-setting and Support  Additional Comments:  Jared [name]  attended group and participated in recovery goals. Patient defined recovery in own terms. Patient worked in Occupational psychologist during group and chose two choices that patient would like to see change in, and wrote one goal for each change in recovery. A choice patient want to change to have medications for pain.   Jared [name] 10/29/2012, 7:11 PM

## 2012-10-29 NOTE — Progress Notes (Addendum)
D: Patient in bed at the beginning of this shift. He complaint of back pain of 8 on a scale of 1-10 with 10 the worst. His mood and affect irritable and sad. Patient requested for medication for pain. He denied SI/HI and denied hallucinations.. A: Writer encouraged and supported patient. Notified PA about patient's complaint. He ordered Naproxen for pain. R: Patient received the pain medication with his HS medications. Q 15 minute check continues as ordered to maintain safety.

## 2012-10-29 NOTE — [facility] Group Notes (Signed)
[facility] LCSW Aftercare Discharge Planning Group Note   10/29/2012 4:14 PM  Participation Quality:  Did not attend  Michah Minton, Joesph July
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
## 2012-10-30 DIAGNOSIS — F329 Major depressive disorder, single episode, unspecified: Secondary | ICD-10-CM

## 2012-10-30 DIAGNOSIS — F1010 Alcohol abuse, uncomplicated: Secondary | ICD-10-CM

## 2012-10-30 MED ORDER — NABUMETONE 500 MG PO TABS
500.0000 mg | ORAL_TABLET | Freq: Two times a day (BID) | ORAL | Status: DC
  Administered 2012-10-30 – 2012-11-01 (×4): 500 mg via ORAL
  Filled 2012-10-30 (×2): qty 6
  Filled 2012-10-30 (×7): qty 1

## 2012-10-30 MED ORDER — METHOCARBAMOL 500 MG PO TABS
500.0000 mg | ORAL_TABLET | Freq: Three times a day (TID) | ORAL | Status: DC
  Administered 2012-10-30 – 2012-11-01 (×5): 500 mg via ORAL
  Filled 2012-10-30 (×13): qty 1

## 2012-10-30 MED ORDER — GABAPENTIN 100 MG PO CAPS
200.0000 mg | ORAL_CAPSULE | Freq: Three times a day (TID) | ORAL | Status: DC
  Administered 2012-10-30 – 2012-11-01 (×5): 200 mg via ORAL
  Filled 2012-10-30: qty 18
  Filled 2012-10-30: qty 2
  Filled 2012-10-30: qty 1
  Filled 2012-10-30 (×2): qty 2
  Filled 2012-10-30: qty 1
  Filled 2012-10-30: qty 2
  Filled 2012-10-30: qty 18
  Filled 2012-10-30 (×5): qty 2
  Filled 2012-10-30: qty 18

## 2012-10-30 MED ORDER — PANTOPRAZOLE SODIUM 20 MG PO TBEC
20.0000 mg | DELAYED_RELEASE_TABLET | Freq: Two times a day (BID) | ORAL | Status: DC
  Administered 2012-10-30 – 2012-11-01 (×4): 20 mg via ORAL
  Filled 2012-10-30 (×3): qty 1
  Filled 2012-10-30: qty 6
  Filled 2012-10-30 (×3): qty 1
  Filled 2012-10-30: qty 6
  Filled 2012-10-30: qty 1

## 2012-10-30 NOTE — Tx Team (Signed)
Interdisciplinary Treatment Plan Update   Date Reviewed:  10/30/2012  Time Reviewed:  12:48 PM  Progress in Treatment:   Attending groups: Yes Participating in groups: Yes Taking medication as prescribed: Yes  Tolerating medication: Yes Family/Significant other contact made: No, but will ask patient for consent for collateral contact Patient understands diagnosis: Yes  Discussing patient identified problems/goals with staff: Yes Medical problems stabilized or resolved: Yes Denies suicidal/homicidal ideation: Yes Patient has not harmed self or others: Yes  For review of initial/current patient goals, please see plan of care.  Estimated Length of Stay:  2-3 days  Reasons for Continued Hospitalization:  Anxiety Depression Medication stabilization   New Problems/Goals identified:    Discharge Plan or Barriers:   Home with outpatient follow up [facility]  Additional Comments:  Patient reports admitting to hospital with depression associated with pain.  MD to evaluate for medication needs.  Attendees:  Patient:  10/30/2012 12:48 PM   Signature: Patrick North, MD 10/30/2012 12:48 PM  Signature:Tina Arlana Pouch, RN 10/30/2012 12:48 PM  Signature: Harold Barban, RN 10/30/2012 12:48 PM  Signature: 10/30/2012 12:48 PM  Signature:  10/30/2012 12:48 PM  Signature:  Juline Patch, LCSW 10/30/2012 12:48 PM  Signature: Silverio Decamp, PMH-NP 10/30/2012 12:48 PM  Signature:  Maseta Dorley,Care Coordinator 10/30/2012 12:48 PM  Signature:  10/30/2012 12:48 PM  Signature:    Signature:    Signature:      Scribe for Treatment Team:   Juline Patch,  10/30/2012 12:48 PM
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
## 2012-10-30 NOTE — Progress Notes (Signed)
D: Patient in the dayroom interacting with a peer on approach.  Patient states he had an alright day.  Patient states he was in a lot of pain today.  Patient states it is miserable to live in so much pain.  Patient denies SI/HI and denies AVH. A: Staff to monitor Q 15 mins for safety.  Encouragement and support offered. No scheduled medications administered per orders.  Trazodone administered prn for sleep and vistaril administered prn for anxiety. R: Patient remains safe on the unit.  Patient attended group tonight.  Patient cooperative and taking administered medications per orders.  Patient visible on the unit and interacting with peers.

## 2012-10-30 NOTE — [facility] Suicide Risk Assessment (Signed)
[facility] INPATIENT:  Family/Significant Other Suicide Prevention Education  Suicide Prevention Education:  Education Completed; Jared [name], Friend, [phone]; has been identified by the patient as the family member/significant other with whom the patient will be residing, and identified as the person(s) who will aid the patient in the event of a mental health crisis (suicidal ideations/suicide attempt).  With written consent from the patient, the family member/significant other has been provided the following suicide prevention education, prior to the and/or following the discharge of the patient.  The suicide prevention education provided includes the following:  Suicide risk factors  Suicide prevention and interventions  National Suicide Hotline telephone number  [facility] assessment telephone number  [facility] Emergency Assistance 911  [facility] and/or Residential Mobile Crisis Unit telephone number  Request made of family/significant other to:  Remove weapons (e.g., guns, rifles, knives), all items previously/currently identified as safety concern.  Friend reports patient does not have guns.  Remove drugs/medications (over-the-counter, prescriptions, illicit drugs), all items previously/currently identified as a safety concern.  The family member/significant other verbalizes understanding of the suicide prevention education information provided.  The family member/significant other agrees to remove the items of safety concern listed above.  Jared [name] 10/30/2012, 3:15 PM

## 2012-10-30 NOTE — Progress Notes (Signed)
  D) Patient quiet but cooperative upon my assessment. Patient completed Patient Self Inventory, reports slept "fair," and  appetite is "good." Patient rates depression as  5 /10, patient rates hopeless feelings as  5/10. Patient denies SI/HI, denies A/V hallucinations.   A) Patient offered support and encouragement, patient encouraged to discuss feelings/concerns with staff. Patient verbalized understanding. Patient monitored Q15 minutes for safety. Patient met with MD  to discuss today's goals and plan of care.  R) Patient visible in milieu, attending groups in day room and meals in dining room. Patient appropriate with staff and peers.   Patient taking medications as ordered. Patient has a plan to "try to get suboxen" for his chronic neck and back pain. Will continue to monitor.

## 2012-10-30 NOTE — Progress Notes (Signed)
[facility] MD Progress Note  10/30/2012 4:41 PM Jared [name]  MRN:  [id number] Subjective:  5/10 depression,no suicidal ideations, sleep poor, appetite fair, anxiety increases with frustration level.  He still complains of generalized pain especially in the back and neck from past injuries--non-narcotic medications started.  Patient was outside when assessed and started shooting basketball.  Diagnosis:   Axis I: Alcohol Abuse, Anxiety Disorder NOS, Depressive Disorder NOS, Substance Abuse and Substance Induced Mood Disorder Axis II: Deferred Axis III:  Past Medical History  Diagnosis Date  . Chronic pain   . Polysubstance abuse   . Back pain   . Depression   . Alcoholism   . Anxiety   . Migraine   . Arthritis     DDD  . Drug abuse 08/13/2012   Axis IV: economic problems, other psychosocial or environmental problems, problems related to social environment and problems with primary support group Axis V: 41-50 serious symptoms  ADL's:  Intact  Sleep: Poor  Appetite:  Fair  Suicidal Ideation:  Denies Homicidal Ideation:  Denies  Psychiatric Specialty Exam: Review of Systems  Constitutional: Negative.   HENT: Negative.   Eyes: Negative.   Respiratory: Negative.   Cardiovascular: Negative.   Gastrointestinal: Negative.   Genitourinary: Negative.   Musculoskeletal: Negative.   Skin: Negative.   Neurological: Negative.   Endo/Heme/Allergies: Negative.   Psychiatric/Behavioral: Positive for depression and substance abuse. The patient is nervous/anxious.     Blood pressure 132/82, pulse 60, temperature 97.5 F (36.4 C), temperature source Oral, resp. rate 18, height 5\' 7"  (1.702 m), weight 74.39 kg (164 lb).Body mass index is 25.68 kg/(m^2).  General Appearance: Casual  Eye Contact::  Fair  Speech:  Normal Rate  Volume:  Normal  Mood:  Anxious and Depressed  Affect:  Congruent  Thought Process:  Coherent  Orientation:  Full (Time, Place, and Person)  Thought Content:  WDL   Suicidal Thoughts:  No  Homicidal Thoughts:  No  Memory:  Immediate;   Fair Recent;   Fair Remote;   Fair  Judgement:  Fair  Insight:  Fair  Psychomotor Activity:  Normal  Concentration:  Fair  Recall:  Fair  Akathisia:  No  Handed:  Right  AIMS (if indicated):     Assets:  Communication Skills Desire for Improvement Resilience Social Support  Sleep:  Number of Hours: 6.75   Current Medications: Current Facility-Administered Medications  Medication Dose Route Frequency Provider Last Rate Last Dose  . acetaminophen (TYLENOL) tablet 650 mg  650 mg Oral Q6H PRN Fransisca Kaufmann, NP   650 mg at 10/30/12 1307  . alum & mag hydroxide-simeth (MAALOX/MYLANTA) 200-200-20 MG/5ML suspension 30 mL  30 mL Oral Q4H PRN Fransisca Kaufmann, NP      . FLUoxetine (PROZAC) capsule 10 mg  10 mg Oral Daily Nanine Means, NP   10 mg at 10/30/12 0807  . gabapentin (NEURONTIN) capsule 200 mg  200 mg Oral TID Nanine Means, NP      . hydrOXYzine (ATARAX/VISTARIL) tablet 25 mg  25 mg Oral Q6H PRN Fransisca Kaufmann, NP   25 mg at 10/30/12 1307  . magnesium hydroxide (MILK OF MAGNESIA) suspension 30 mL  30 mL Oral Daily PRN Fransisca Kaufmann, NP      . methocarbamol (ROBAXIN) tablet 500 mg  500 mg Oral TID Nanine Means, NP      . nabumetone (RELAFEN) tablet 500 mg  500 mg Oral BID Nanine Means, NP      . pantoprazole (PROTONIX)  EC tablet 20 mg  20 mg Oral BID Nanine Means, NP      . traZODone (DESYREL) tablet 50 mg  50 mg Oral QHS PRN,MR X 1 Fransisca Kaufmann, NP   50 mg at 10/29/12 2131    Lab Results: No results found for this or any previous visit (from the past 48 hour(s)).  Physical Findings: AIMS: Facial and Oral Movements Muscles of Facial Expression: None, normal Lips and Perioral Area: None, normal Jaw: None, normal Tongue: None, normal,Extremity Movements Upper (arms, wrists, hands, fingers): None, normal Lower (legs, knees, ankles, toes): None, normal, Trunk Movements Neck, shoulders, hips: None, normal, Overall  Severity Severity of abnormal movements (highest score from questions above): None, normal Incapacitation due to abnormal movements: None, normal Patient's awareness of abnormal movements (rate only patient's report): No Awareness, Dental Status Current problems with teeth and/or dentures?: No (no teeth, no dentures) Does patient usually wear dentures?: No  CIWA:  CIWA-Ar Total: 2 COWS:  COWS Total Score: 2  Treatment Plan Summary: Daily contact with patient to assess and evaluate symptoms and progress in treatment Medication management  Plan:  Review of chart, vital signs, medications, and notes. 1-Individual and group therapy 2-Medication management for depression and anxiety:  Medications reviewed with the patient and Relafen 500 mg for MSK pain started, Robaxin 500 mg TID for muscle spasms, and gabapentin 200 mg TID for neuropathic pain. 3-Coping skills for depression, anxiety, and pain 4-Continue crisis stabilization and management 5-Address health issues--monitoring vital signs, stable 6-Treatment plan in progress to prevent relapse of depression, substance abuse and anxiety  Medical Decision Making Problem Points:  Established problem, stable/improving (1) and Review of psycho-social stressors (1) Data Points:  Review of new medications or change in dosage (2)  I certify that inpatient services furnished can reasonably be expected to improve the patient's condition.   Nanine Means, PMH-NP 10/30/2012, 4:41 PM

## 2012-10-30 NOTE — Progress Notes (Signed)
Adult Psychoeducational Group Note  Date:  10/30/2012 Time:  11:26 PM  Group Topic/Focus:  Goals Group:   The focus of this group is to help patients establish daily goals to achieve during treatment and discuss how the patient can incorporate goal setting into their daily lives to aide in recovery.  Participation Level:  Active  Participation Quality:  Appropriate  Affect:  Appropriate  Cognitive:  Appropriate  Insight: Appropriate  Engagement in Group:  Engaged  Modes of Intervention:  Discussion  Additional Comments:  Pt stated that he has some pain but was able to get through the day. Pt found a book that he really enjoys and says that is put him in a positive place.   Terie Purser R 10/30/2012, 11:26 PM

## 2012-10-30 NOTE — [facility] Group Notes (Signed)
[facility] LCSW Aftercare Discharge Planning Group Note   10/30/2012 10:42 AM  Participation Quality:  Appropriate  Mood/Affect:  Appropriate, Depressed and Irritable  Depression Rating:  5-6  Anxiety Rating:  7  Thoughts of Suicide: No  Will you contract for safety?   NA  Current AVH:  No  Plan for Discharge/Comments:   Patient report not doing well today.  He is endorsing pain rated at eight.  He advised he in on edge and it would not take much to set him off.  Patient will need a referral for outpatient services.  He has home and transportation.  Transportation Means: Patient has transportation.  Supports:  Patient has limited support system.   Brittyn Salaz, Joesph July

## 2012-10-30 NOTE — Progress Notes (Signed)
Recreation Therapy Notes  Date: 05.21.2014  Time: 3:00pm  Location: [address] Dayroom   Group Topic/Focus: Leisure Education   Participation Level:  Active   Participation Quality:  Appropriate   Affect:  Euthymic   Cognitive:  Appropriate   Additional Comments: Activity: Leisure Time Clock ; Explanation: Patients were asked to identify how much time out of their day they spend participating in work, self care, miscellaneous, and leisure.   Patient asked LRT if he had to complete the worksheet given. LRT stated he could just participate in group discussion, but encouraged patient to hold onto worksheet to fill it out later. Patient was agreeable to this. Patient stayed in group session until approximately 3:20pm, then exited group space. Patient returned to group session as it was ending.   Marykay Lex Rigdon Macomber, LRT/CTRS  Jearl Klinefelter 10/30/2012 4:49 PM

## 2012-10-31 NOTE — [facility] Group Notes (Signed)
[facility] LCSW Group Therapy  10/31/2012  1:15 PM   Type of Therapy:  Group Therapy  Participation Level:  Active  Participation Quality:  Appropriate and Attentive  Affect:  Appropriate  Cognitive:  Alert, Appropriate and Oriented  Insight:  Developing/Improving, Engaged and Supportive  Engagement in Therapy:  Developing/Improving, Engaged and Supportive  Modes of Intervention:  Clarification, Confrontation, Discussion, Education, Exploration, Limit-setting, Orientation, Problem-solving, Rapport Building, Dance movement psychotherapist, Socialization and Support  Summary of Progress/Problems: Patient was attentive and engaged with speaker from [employer].  Patient expressed interest in their programs and services.  Patient processed ways they can relate to the speaker.  Pt was interested in getting a peer support for added support upon d/c.   Reyes Ivan, LCSWA 10/31/2012 3:02 PM

## 2012-10-31 NOTE — Progress Notes (Signed)
Adult Psychoeducational Group Note  Date:  10/31/2012 Time:  11:00am  Group Topic/Focus:  Overcoming Stress:   The focus of this group is to define stress and help patients assess their triggers.  Participation Level:  Active  Participation Quality:  Appropriate  Affect:  Appropriate  Cognitive:  Alert and Appropriate  Insight: Good  Engagement in Group:  Monopolizing  Modes of Intervention:  Discussion  Additional Comments:  Pt discussed arguing in relationships is his biggest stressor. He shared "when people are fighting, I walk out". At times he monopolized discussion but was easily redirected.  Reynolds Bowl 10/31/2012, 2:48 PM

## 2012-10-31 NOTE — [facility] Group Notes (Signed)
[facility] LCSW Aftercare Discharge Planning Group Note   10/31/2012 8:45 AM  Participation Quality:  Alert and Appropriate   Mood/Affect:  Appropriate  Depression Rating:  5  Anxiety Rating:  5  Thoughts of Suicide:  Pt denies SI/HI  Will you contract for safety?   Yes  Current AVH:  No  Plan for Discharge/Comments:  Pt attended discharge planning group and actively participated in group.  CSW provided pt with today's workbook.  Pt states that he is in pain today but is doing all right.  Pt went on a tangent to the group about the hospital not helping with everyone's pain and if the pain was resolved no one would be depressed.  Pt states that the hospital is wasting millions of dollars.  Pt has follow up scheduled with [facility] for medication management and therapy.  No further needs voiced by pt at this time.    Transportation Means: Pt reports having access to transportation  Supports: No supports mentioned at this time  Reyes Ivan, LCSWA 10/31/2012 10:20 AM
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
## 2012-10-31 NOTE — Progress Notes (Signed)
D: Patient appropriate and cooperative with staff and peers. Patient's affect/mood is depressed and anxious at times. Patient complained of anxiety. He reported on the self inventory sheet that his sleep is poor, appetite/ability to pay attention is good, and energy level is low. Patient rated depression and feelings of hopelessness "5". He's compliant with medication regimen and participating in groups. Patient is interacting with peers in the milieu.   A: Support and encouragement provided to patient. Scheduled medications administered per MD orders. PRN Vistaril given for anxiety. Maintain Q15 minute checks for safety.  R: Patient receptive. Denies SI/HI/AVH. Patient remains safe.

## 2012-10-31 NOTE — Progress Notes (Signed)
Patient ID: Jared [name], male   DOB: 17-Aug-1960, 52 y.o.   MRN: [id number] [facility] MD Progress Note  10/31/2012 11:13 AM Jared [name]  MRN:  [id number] Subjective:  Patient visible on the unit and is observed interacting with peers. Patient needed to be redirected during assessment to focus on his psychiatric symptoms. The patient was noted to be tangential about his chronic pain and wanting to get on disability for this. Patient also talked about "God being the only one who can help me. I'm not sure he can heal my body for all I've done to it. I guess you reap what you sow." He rates his depression at three reporting that it is linked with his pain levels. Denies having any SI thoughts. Patient fixated on being on narcotics or suboxone stating "Nothing else works when you have been on that stuff." The patient however did not ask to have his medication regimen changed today.  Objective: Patient appears to function very well on the unit and is able to independently take care of his daily needs. He does not appear to be in extreme pain.  Diagnosis:   Axis I: Alcohol Abuse, Anxiety Disorder NOS, Depressive Disorder NOS, Substance Abuse and Substance Induced Mood Disorder Axis II: Deferred Axis III:  Past Medical History  Diagnosis Date  . Chronic pain   . Polysubstance abuse   . Back pain   . Depression   . Alcoholism   . Anxiety   . Migraine   . Arthritis     DDD  . Drug abuse 08/13/2012   Axis IV: economic problems, other psychosocial or environmental problems, problems related to social environment and problems with primary support group Axis V: 41-50 serious symptoms  ADL's:  Intact  Sleep: Poor  Appetite:  Fair  Suicidal Ideation:  Denies Homicidal Ideation:  Denies  Psychiatric Specialty Exam: Review of Systems  Constitutional: Negative.   HENT: Negative.   Eyes: Negative.   Respiratory: Negative.   Cardiovascular: Negative.   Gastrointestinal: Negative.   Genitourinary:  Negative.   Musculoskeletal: Positive for back pain and joint pain.  Skin: Negative.   Neurological: Negative.   Endo/Heme/Allergies: Negative.   Psychiatric/Behavioral: Positive for depression and substance abuse. Negative for suicidal ideas, hallucinations and memory loss. The patient is nervous/anxious. The patient does not have insomnia.     Blood pressure 124/82, pulse 70, temperature 97.9 F (36.6 C), temperature source Oral, resp. rate 20, height 5\' 7"  (1.702 m), weight 74.39 kg (164 lb).Body mass index is 25.68 kg/(m^2).  General Appearance: Casual  Eye Contact::  Fair  Speech:  Normal Rate  Volume:  Normal  Mood:  Anxious and Depressed  Affect:  Congruent  Thought Process:  Coherent, Hypereligious  Orientation:  Full (Time, Place, and Person)  Thought Content:  Rumination  Suicidal Thoughts:  No  Homicidal Thoughts:  No  Memory:  Immediate;   Fair Recent;   Fair Remote;   Fair  Judgement:  Fair  Insight:  Fair  Psychomotor Activity:  Normal  Concentration:  Fair  Recall:  Fair  Akathisia:  No  Handed:  Right  AIMS (if indicated):     Assets:  Communication Skills Desire for Improvement Resilience Social Support  Sleep:  Number of Hours: 6.5   Current Medications: Current Facility-Administered Medications  Medication Dose Route Frequency Provider Last Rate Last Dose  . acetaminophen (TYLENOL) tablet 650 mg  650 mg Oral Q6H PRN Fransisca Kaufmann, NP   650 mg at 10/30/12  1307  . alum & mag hydroxide-simeth (MAALOX/MYLANTA) 200-200-20 MG/5ML suspension 30 mL  30 mL Oral Q4H PRN Fransisca Kaufmann, NP      . FLUoxetine (PROZAC) capsule 10 mg  10 mg Oral Daily Nanine Means, NP   10 mg at 10/31/12 0743  . gabapentin (NEURONTIN) capsule 200 mg  200 mg Oral TID Nanine Means, NP   200 mg at 10/31/12 0743  . hydrOXYzine (ATARAX/VISTARIL) tablet 25 mg  25 mg Oral Q6H PRN Fransisca Kaufmann, NP   25 mg at 10/31/12 0626  . magnesium hydroxide (MILK OF MAGNESIA) suspension 30 mL  30 mL Oral Daily  PRN Fransisca Kaufmann, NP      . methocarbamol (ROBAXIN) tablet 500 mg  500 mg Oral TID Nanine Means, NP   500 mg at 10/31/12 0743  . nabumetone (RELAFEN) tablet 500 mg  500 mg Oral BID Nanine Means, NP   500 mg at 10/31/12 0743  . pantoprazole (PROTONIX) EC tablet 20 mg  20 mg Oral BID Nanine Means, NP   20 mg at 10/31/12 0743  . traZODone (DESYREL) tablet 50 mg  50 mg Oral QHS PRN,MR X 1 Fransisca Kaufmann, NP   50 mg at 10/30/12 2147    Lab Results: No results found for this or any previous visit (from the past 48 hour(s)).  Physical Findings: AIMS: Facial and Oral Movements Muscles of Facial Expression: None, normal Lips and Perioral Area: None, normal Jaw: None, normal Tongue: None, normal,Extremity Movements Upper (arms, wrists, hands, fingers): None, normal Lower (legs, knees, ankles, toes): None, normal, Trunk Movements Neck, shoulders, hips: None, normal, Overall Severity Severity of abnormal movements (highest score from questions above): None, normal Incapacitation due to abnormal movements: None, normal Patient's awareness of abnormal movements (rate only patient's report): No Awareness, Dental Status Current problems with teeth and/or dentures?: No (no teeth, no dentures) Does patient usually wear dentures?: No  CIWA:  CIWA-Ar Total: 2 COWS:  COWS Total Score: 2  Treatment Plan Summary: Daily contact with patient to assess and evaluate symptoms and progress in treatment Medication management  Plan:  Review of chart, vital signs, medications, and notes. 1-Individual and group therapy 2-Medication management for depression and anxiety:  Reviewed current regimen with patient who denies any untoward effects.  3-Coping skills for depression, anxiety, and pain 4-Continue crisis stabilization and management 5-Address health issues--monitoring vital signs, stable 6-Treatment plan in progress to prevent relapse of depression, substance abuse and anxiety  Medical Decision Making Problem  Points:  Established problem, stable/improving (1) and Review of psycho-social stressors (1) Data Points:  Review of new medications or change in dosage (2)  I certify that inpatient services furnished can reasonably be expected to improve the patient's condition.   Fransisca Kaufmann, NP-C 10/31/2012, 11:13 AM

## 2012-11-01 DIAGNOSIS — F39 Unspecified mood [affective] disorder: Secondary | ICD-10-CM

## 2012-11-01 MED ORDER — FLUOXETINE HCL 10 MG PO CAPS: 10.0000 mg | ORAL_CAPSULE | Freq: Every day | ORAL | Status: DC

## 2012-11-01 MED ORDER — TRAZODONE HCL 50 MG PO TABS: 50.0000 mg | ORAL_TABLET | Freq: Every evening | ORAL | Status: DC | PRN

## 2012-11-01 MED ORDER — NABUMETONE 500 MG PO TABS: 500.0000 mg | ORAL_TABLET | Freq: Two times a day (BID) | ORAL | Status: DC

## 2012-11-01 MED ORDER — PANTOPRAZOLE SODIUM 20 MG PO TBEC: 20.0000 mg | DELAYED_RELEASE_TABLET | Freq: Two times a day (BID) | ORAL | Status: DC

## 2012-11-01 MED ORDER — GABAPENTIN 100 MG PO CAPS: 200.0000 mg | ORAL_CAPSULE | Freq: Three times a day (TID) | ORAL | Status: DC

## 2012-11-01 NOTE — [facility] Group Notes (Signed)
[facility] LCSW Aftercare Discharge Planning Group Note   11/01/2012 8:45 AM  Participation Quality:  Alert and Appropriate   Mood/Affect:  Appropriate  Depression Rating:  5  Anxiety Rating:  5  Thoughts of Suicide:  Pt denies SI/HI  Will you contract for safety?   Yes  Current AVH:  No  Plan for Discharge/Comments:  Pt attended discharge planning group and actively participated in group.  CSW provided pt with today's workbook.  Pt states that he is ready to d/c when staff feels he is ready.  Pt states that he has a lot of pain today.  Pt has follow up scheduled with [facility] for medication management and therapy.  No further needs voiced by pt at this time.    Transportation Means: Pt has access to transportation   Supports: Pt names his friend as supportive  Reyes Ivan, LCSWA 11/01/2012 9:29 AM

## 2012-11-01 NOTE — Discharge Summary (Signed)
Physician Discharge Summary Note  Patient:  Jared [name] is an 52 y.o., male MRN:  [id number] DOB:  [birth year] Patient phone:  [phone] (home)  Patient address:   [address] [name] [address],   Date of Admission:  10/28/2012 Date of Discharge: 11/01/12  Reason for Admission:  Depression related to chronic pain, SI   Discharge Diagnoses: Principal Problem:   Major depressive disorder, recurrent episode, moderate  Review of Systems  Constitutional: Negative.   HENT: Negative.   Eyes: Negative.   Respiratory: Negative.   Cardiovascular: Negative.   Gastrointestinal: Negative.   Genitourinary: Negative.   Musculoskeletal: Positive for back pain and joint pain.  Skin: Negative.   Neurological: Negative.   Endo/Heme/Allergies: Negative.   Psychiatric/Behavioral: Positive for depression. Negative for suicidal ideas, hallucinations, memory loss and substance abuse. The patient is not nervous/anxious and does not have insomnia.    Axis Diagnosis:   AXIS I:  Mood Disorder NOS AXIS II:  Deferred AXIS III:   Past Medical History  Diagnosis Date  . Chronic pain   . Polysubstance abuse   . Back pain   . Depression   . Alcoholism   . Anxiety   . Migraine   . Arthritis     DDD  . Drug abuse 08/13/2012   AXIS IV:  other psychosocial or environmental problems AXIS V:  61-70 mild symptoms  Level of Care:  OP  Hospital Course:  Jared [name] is an 52 y.o. male that was assessed this day after presenting to [facility] reporting SI with plan to shoot/hang self or to run into traffic. Pt stated he has been having SI due to his chronic pain. Pt has been admitted inpatient in 06/2012 and unknown other dates at other places due to [facility] and SA, but pt did not elaborate on details. Pt has also gone to [facility] in the past for depression and received therapy there. Pt stated he has a long hx of SA. However, pt reported currently, he gets various opiates off of the street (unknown amounts,  various types), and occasional alcohol use, but his UDS is only positive for benzos. Pt stated he is prescribed Xanax by his PCP, but that his PCP will not be prescribing meds for him anymore and told him to get into a pain clinic. Pt denies HI or psychosis. Pt did talk a great deal about being saved, God and the Devil. No psychosis noted. Pt denies AVH or delusions. Pt admits to ongoing depression and SI. Pt reports poor sleep and appetite. Pt has no support and lives with a friend.       The duration of stay was four days.  The patient was seen and evaluated by the Treatment team consisting of Psychiatrist, NP-C, RN, Case Manager, and Therapist for evaluation and treatment plan with goal of stabilization upon discharge. The patient's physical and mental health problems were identified and treated appropriately.      Multiple modalities of treatment were used including medication, individual and group therapies, unit programming, improved nutrition, physical activity, and family sessions as needed. The patient's medications were managed by the MD. Jared [name] reported obtaining methadone, xanax, and klonopin from friends for his prior to admission medications. Patient was started on Prozac 10 mg daily to target his depressive symptoms. Patient was very focused on his issues with pain during the admission. He often needed redirection during groups for talking out of turn about his pain. The patient was fixated on obtaining suboxone, which  he reported helped manage it best. Jared [name] was started on a pain regimen during his admission that included Neurontin 200 mg TID, Robaxin 500 mg po TID, Relafen 500 mg po bid. The patient was given Trazodone 50 mg prn at bedtime to help improve his quality of sleep.      The symptoms of depression were monitored daily by evaluation by clinical provider.  The patient's mental and emotional status was evaluated by a daily self inventory completed by the patient. The patient reported that his  pain levels were tied to his depression. Jared [name] was noted by multiple staff to function quite well on the unit and did not appear to be in severe pain.       Improvement was demonstrated by declining numbers on the self assessment, improving vital signs, increased cognition, and improvement in mood, sleep, appetite as well as a reduction in physical symptoms.       The patient was evaluated and found to be stable enough for discharge and was released to home per the initial plan of treatment. Patient spoke to the treatment team prior to his discharge. He denied any SI/HI. Patient stated "I'm doing better now."  Patient received prescriptions for his medications and received a three day supply of medications. The patient was found to be physically and emotionally stable for discharge.   Mental Status Exam:  For mental status exam please see mental status exam and  suicide risk assessment completed by attending physician prior to discharge.   Consults:  None  Significant Diagnostic Studies:  labs: Chem profile, CBC, UDS positive for benzos  Discharge Vitals:   Blood pressure 122/80, pulse 74, temperature 98.6 F (37 C), temperature source Oral, resp. rate 20, height 5\' 7"  (1.702 m), weight 74.39 kg (164 lb). Body mass index is 25.68 kg/(m^2). Lab Results:   No results found for this or any previous visit (from the past 72 hour(s)).  Physical Findings: AIMS: Facial and Oral Movements Muscles of Facial Expression: None, normal Lips and Perioral Area: None, normal Jaw: None, normal Tongue: None, normal,Extremity Movements Upper (arms, wrists, hands, fingers): None, normal Lower (legs, knees, ankles, toes): None, normal, Trunk Movements Neck, shoulders, hips: None, normal, Overall Severity Severity of abnormal movements (highest score from questions above): None, normal Incapacitation due to abnormal movements: None, normal Patient's awareness of abnormal movements (rate only patient's report):  No Awareness, Dental Status Current problems with teeth and/or dentures?: Yes Does patient usually wear dentures?: No  CIWA:  CIWA-Ar Total: 2 COWS:  COWS Total Score: 2  Psychiatric Specialty Exam: See Psychiatric Specialty Exam and Suicide Risk Assessment completed by Attending Physician prior to discharge.  Discharge destination:  Home  Is patient on multiple antipsychotic therapies at discharge:  No   Has Patient had three or more failed trials of antipsychotic monotherapy by history:  No  Recommended Plan for Multiple Antipsychotic Therapies: N/A  Discharge Orders   Future Orders Complete By Expires     Activity as tolerated - No restrictions  As directed         Medication List    STOP taking these medications       aspirin 81 MG tablet     KLONOPIN PO     METHADONE HCL PO     XANAX PO      TAKE these medications     Indication   FLUoxetine 10 MG capsule  Commonly known as:  PROZAC  Take 1 capsule (10 mg total) by mouth  daily. For depression   Indication:  Depression     gabapentin 100 MG capsule  Commonly known as:  NEURONTIN  Take 2 capsules (200 mg total) by mouth 3 (three) times daily. For pain and anxiety   Indication:  neuropathic pain     nabumetone 500 MG tablet  Commonly known as:  RELAFEN  Take 1 tablet (500 mg total) by mouth 2 (two) times daily. For pain control   Indication:  Joint Damage causing Pain and Loss of Function     pantoprazole 20 MG tablet  Commonly known as:  PROTONIX  Take 1 tablet (20 mg total) by mouth 2 (two) times daily. For acid reflux   Indication:  Gastroesophageal Reflux Disease     traZODone 50 MG tablet  Commonly known as:  DESYREL  Take 1 tablet (50 mg total) by mouth at bedtime as needed and may repeat dose one time if needed for sleep.   Indication:  Trouble Sleeping, Major Depressive Disorder           Follow-up Information   Follow up with [facility] On 11/06/2012. (Walk in clinic 8 am - 3 pm.  Say that  you're a a hospital discharge (for medication management))    Contact information:   [address] [place], [place]   [address] Phone: [phone] Fax: [phone]      Follow up with [facility] On 11/05/2012. (Tuesday, Nov 05, 2012 at 2:00 PM for counseling)    Contact information:   [address] [place], [place] [address]  Phone: [phone] Fax: [phone]      Follow-up recommendations:  Activity:  Resume usual activities Diet:  Regular Other: Patient is encouraged to follow up as scheduled with [facility].  Comments:   Take all your medications as prescribed by your mental healthcare provider.  Report any adverse effects and or reactions from your medicines to your outpatient provider promptly.  Patient is instructed and cautioned to not engage in alcohol and or illegal drug use while on prescription medicines.  In the event of worsening symptoms, patient is instructed to call the crisis hotline, 911 and or go to the nearest ED for appropriate evaluation and treatment of symptoms.  Follow-up with your primary care provider for your other medical issues, concerns and or health care needs.    Total Discharge Time:  Greater than 30 minutes.  SignedFransisca Kaufmann NP-C 11/01/2012, 10:33 AM

## 2012-11-01 NOTE — Tx Team (Signed)
Interdisciplinary Treatment Plan Update (Adult)  Date: 11/01/2012  Time Reviewed:  9:45 AM  Progress in Treatment: Attending groups: Yes Participating in groups:  Yes Taking medication as prescribed:  Yes Tolerating medication:  Yes Family/Significant othe contact made: Yes, contact made  Patient understands diagnosis:  Yes Discussing patient identified problems/goals with staff:  Yes Medical problems stabilized or resolved:  Yes Denies suicidal/homicidal ideation: Yes Issues/concerns per patient self-inventory:  Yes Other:  New problem(s) identified: N/A  Discharge Plan or Barriers:Pt has follow up scheduled with [facility] for medication management and therapy.     Reason for Continuation of Hospitalization: Stable to d/c  Comments: N/A  Estimated length of stay: D/C today  For review of initial/current patient goals, please see plan of care.  Attendees: Patient:  Jared [name] 11/01/2012 9:50 AM   Family:     Physician:  Dr. Daleen Bo 11/01/2012 8:15 AM   Nursing:   Harold Barban, RN 11/01/2012 8:15 AM   Clinical Social Worker:  Reyes Ivan, LCSWA 11/01/2012 8:15 AM   Other: Fransisca Kaufmann, NP 11/01/2012 8:15 AM   Other:  Frankey Shown, MA - transitional team 11/01/2012 8:15 AM  Other:     Other:     Other:    Other:    Other:    Other:    Other:    Other:      Scribe for Treatment Team:   Carmina Miller, 11/01/2012 8:15 AM

## 2012-11-01 NOTE — [facility] Suicide Risk Assessment (Signed)
Suicide Risk Assessment  Discharge Assessment     Demographic Factors:  Male, Caucasian, Low socioeconomic status, Living alone and Unemployed  Mental Status Per Nursing Assessment::   On Admission:  Self-harm thoughts  Current Mental Status by Physician: Patient alert and oriented to 4. Denies any aH/VH/SI/HI.  Loss Factors: Decline in physical health and Financial problems/change in socioeconomic status  Historical Factors: Family history of mental illness or substance abuse and Impulsivity  Risk Reduction Factors:   Positive social support and Positive coping skills or problem solving skills  Continued Clinical Symptoms:  Depression:   Recent sense of peace/wellbeing Alcohol/Substance Abuse/Dependencies  Cognitive Features That Contribute To Risk:  Thought constriction (tunnel vision)    Suicide Risk:  Minimal: No identifiable suicidal ideation.  Patients presenting with no risk factors but with morbid ruminations; may be classified as minimal risk based on the severity of the depressive symptoms  Discharge Diagnoses:   AXIS I:  Mood Disorder NOS AXIS II:  Deferred AXIS III:   Past Medical History  Diagnosis Date  . Chronic pain   . Polysubstance abuse   . Back pain   . Depression   . Alcoholism   . Anxiety   . Migraine   . Arthritis     DDD  . Drug abuse 08/13/2012   AXIS IV:  other psychosocial or environmental problems AXIS V:  61-70 mild symptoms  Plan Of Care/Follow-up recommendations:  Activity:  as tolerated Diet:  regular Follow up with outpatient appointments.  Is patient on multiple antipsychotic therapies at discharge:  No   Has Patient had three or more failed trials of antipsychotic monotherapy by history:  No  Recommended Plan for Multiple Antipsychotic Therapies: NA  Ethyle Tiedt 11/01/2012, 9:57 AM

## 2012-11-01 NOTE — Progress Notes (Signed)
Discharge Note: Discharge instructions/prescriptions/medication samples given to patient. Patient verbalized understanding of discharge instructions and prescriptions. Returned belongings to patient. Denies SI/HI/AVH. Patient d/c without incident. 

## 2012-11-01 NOTE — Progress Notes (Signed)
[facility] Adult Case Management Discharge Plan :  Will you be returning to the same living situation after discharge: Yes,  returning home At discharge, do you have transportation home?:Yes,  pt will have a friend pick pt up Do you have the ability to pay for your medications:Yes,  access to meds  Release of information consent forms completed and in the chart;  Patient's signature needed at discharge.  Patient to Follow up at: Follow-up Information   Follow up with [facility] On 11/06/2012. (Walk in clinic 8 am - 3 pm.  Say that you're a a hospital discharge (for medication management))    Contact information:   [address] [place], [place]   [address] Phone: [phone] Fax: [phone]      Follow up with [facility] On 11/05/2012. (Tuesday, Nov 05, 2012 at 2:00 PM for counseling)    Contact information:   [address] [place], [place] [address]  Phone: [phone] Fax: [phone]      Patient denies SI/HI:   Yes,  denies SI/HI    Safety Planning and Suicide Prevention discussed:  Yes,  discussed with pt and friend (see suicide prevention note)  Horton, Salome Arnt 11/01/2012, 10:07 AM

## 2012-11-01 NOTE — Progress Notes (Signed)
Adult Psychoeducational Group Note  Date:  11/01/2012 Time: 10:00AM  Group Topic/Focus:  Relapse Prevention Planning:   The focus of this group is to define relapse and discuss the need for planning to combat relapse.  Participation Level:  Active  Participation Quality:  Appropriate  Affect:  Appropriate  Cognitive:  Appropriate  Insight: Appropriate  Engagement in Group:  Engaged  Modes of Intervention:  Activity  Additional Comments:  Patient actively participated in the group activity and corresponding discussion. Patient had to be redirected once to remain on topic as he started to stray away from the topic at hand. Patient responded well to the gentle redirection and continued the session without requiring any further redirections. Patient was able to properly voice and express his feelings and identify things that he plans to do to better take care of himself once discharged.   Zacarias Pontes R 11/01/2012, 1:18 PM

## 2012-11-05 NOTE — Progress Notes (Signed)
Patient Discharge Instructions:  After Visit Summary (AVS):   Faxed to:  11/05/12 Discharge Summary Note:   Faxed to:  11/05/12 Psychiatric Admission Assessment Note:   Faxed to:  11/05/12 Suicide Risk Assessment - Discharge Assessment:   Faxed to:  11/05/12 Faxed/Sent to the Next Level Care provider:  11/05/12 Faxed to [facility] @ [phone] Faxed to [facility] @ [phone]  Jerelene Redden, 11/05/2012, 3:42 PM

## 2013-01-07 ENCOUNTER — Telehealth (HOSPITAL_COMMUNITY): Payer: Self-pay | Admitting: *Deleted

## 2013-01-07 NOTE — Telephone Encounter (Signed)
[facility] pharmacy called requesting refill on Prozac. Called back to let them know this client is not seen in the out-patient department and must follow up with his provider.

## 2013-02-20 ENCOUNTER — Encounter (HOSPITAL_COMMUNITY): Payer: Self-pay | Admitting: *Deleted

## 2013-02-20 ENCOUNTER — Emergency Department (HOSPITAL_COMMUNITY)
Admission: EM | Admit: 2013-02-20 | Discharge: 2013-02-20 | Disposition: A | Discharge status: Home / Self Care | Payer: Medicaid Other | Attending: Emergency Medicine | Admitting: Emergency Medicine

## 2013-02-20 DIAGNOSIS — R443 Hallucinations, unspecified: Secondary | ICD-10-CM | POA: Insufficient documentation

## 2013-02-20 DIAGNOSIS — Z791 Long term (current) use of non-steroidal anti-inflammatories (NSAID): Secondary | ICD-10-CM | POA: Insufficient documentation

## 2013-02-20 DIAGNOSIS — G43909 Migraine, unspecified, not intractable, without status migrainosus: Secondary | ICD-10-CM | POA: Insufficient documentation

## 2013-02-20 DIAGNOSIS — F17200 Nicotine dependence, unspecified, uncomplicated: Secondary | ICD-10-CM | POA: Insufficient documentation

## 2013-02-20 DIAGNOSIS — G8929 Other chronic pain: Secondary | ICD-10-CM | POA: Insufficient documentation

## 2013-02-20 DIAGNOSIS — Z79899 Other long term (current) drug therapy: Secondary | ICD-10-CM | POA: Insufficient documentation

## 2013-02-20 DIAGNOSIS — F1021 Alcohol dependence, in remission: Secondary | ICD-10-CM | POA: Insufficient documentation

## 2013-02-20 DIAGNOSIS — M129 Arthropathy, unspecified: Secondary | ICD-10-CM | POA: Insufficient documentation

## 2013-02-20 DIAGNOSIS — F331 Major depressive disorder, recurrent, moderate: Secondary | ICD-10-CM | POA: Insufficient documentation

## 2013-02-20 DIAGNOSIS — R440 Auditory hallucinations: Secondary | ICD-10-CM

## 2013-02-20 DIAGNOSIS — F3289 Other specified depressive episodes: Secondary | ICD-10-CM | POA: Insufficient documentation

## 2013-02-20 DIAGNOSIS — R45850 Homicidal ideations: Secondary | ICD-10-CM | POA: Insufficient documentation

## 2013-02-20 DIAGNOSIS — F411 Generalized anxiety disorder: Secondary | ICD-10-CM | POA: Insufficient documentation

## 2013-02-20 DIAGNOSIS — R45851 Suicidal ideations: Secondary | ICD-10-CM | POA: Insufficient documentation

## 2013-02-20 DIAGNOSIS — F329 Major depressive disorder, single episode, unspecified: Secondary | ICD-10-CM | POA: Insufficient documentation

## 2013-02-20 LAB — CBC WITH DIFFERENTIAL/PLATELET
Eosinophils Absolute: 0.3 10*3/uL (ref 0.0–0.7)
Eosinophils Relative: 3 % (ref 0–5)
HCT: 37 % — ABNORMAL LOW (ref 39.0–52.0)
Lymphocytes Relative: 24 % (ref 12–46)
Lymphs Abs: 2.5 10*3/uL (ref 0.7–4.0)
MCH: 29.6 pg (ref 26.0–34.0)
MCV: 85.5 fL (ref 78.0–100.0)
Monocytes Absolute: 0.5 10*3/uL (ref 0.1–1.0)
Platelets: 262 10*3/uL (ref 150–400)
RBC: 4.33 MIL/uL (ref 4.22–5.81)
RDW: 14.8 % (ref 11.5–15.5)
WBC: 10.4 10*3/uL (ref 4.0–10.5)

## 2013-02-20 LAB — BASIC METABOLIC PANEL
CO2: 26 mEq/L (ref 19–32)
Calcium: 9.1 mg/dL (ref 8.4–10.5)
Creatinine, Ser: 0.66 mg/dL (ref 0.50–1.35)
GFR calc non Af Amer: >90 mL/min (ref >90)
Glucose, Bld: 100 mg/dL — ABNORMAL HIGH (ref 70–99)
Sodium: 136 mEq/L (ref 135–145)

## 2013-02-20 LAB — RAPID URINE DRUG SCREEN, HOSP PERFORMED
Amphetamines: NOT DETECTED
Barbiturates: NOT DETECTED
Tetrahydrocannabinol: NOT DETECTED

## 2013-02-20 MED ORDER — IBUPROFEN 200 MG PO TABS
600.0000 mg | ORAL_TABLET | Freq: Three times a day (TID) | ORAL | Status: DC | PRN
  Administered 2013-02-20: 600 mg via ORAL
  Filled 2013-02-20: qty 3

## 2013-02-20 MED ORDER — LORAZEPAM 1 MG PO TABS: 1.0000 mg | ORAL_TABLET | Freq: Three times a day (TID) | ORAL | Status: DC | PRN

## 2013-02-20 MED ORDER — NICOTINE 21 MG/24HR TD PT24: 21.0000 mg | MEDICATED_PATCH | Freq: Every day | TRANSDERMAL | Status: DC

## 2013-02-20 MED ORDER — ACETAMINOPHEN 325 MG PO TABS: 650.0000 mg | ORAL_TABLET | ORAL | Status: DC | PRN

## 2013-02-20 MED ORDER — ONDANSETRON HCL 4 MG PO TABS: 4.0000 mg | ORAL_TABLET | Freq: Three times a day (TID) | ORAL | Status: DC | PRN

## 2013-02-20 MED ORDER — PANTOPRAZOLE SODIUM 20 MG PO TBEC
20.0000 mg | DELAYED_RELEASE_TABLET | Freq: Two times a day (BID) | ORAL | Status: DC
  Administered 2013-02-20: 20 mg via ORAL
  Filled 2013-02-20 (×2): qty 1

## 2013-02-20 MED ORDER — TRAZODONE HCL 50 MG PO TABS: 50.0000 mg | ORAL_TABLET | Freq: Every evening | ORAL | Status: DC | PRN

## 2013-02-20 MED ORDER — GABAPENTIN 100 MG PO CAPS
200.0000 mg | ORAL_CAPSULE | Freq: Three times a day (TID) | ORAL | Status: DC
  Administered 2013-02-20: 200 mg via ORAL
  Filled 2013-02-20 (×3): qty 2

## 2013-02-20 MED ORDER — NABUMETONE 500 MG PO TABS
500.0000 mg | ORAL_TABLET | Freq: Two times a day (BID) | ORAL | Status: DC
  Administered 2013-02-20: 500 mg via ORAL
  Filled 2013-02-20 (×2): qty 1

## 2013-02-20 MED ORDER — ALUM & MAG HYDROXIDE-SIMETH 200-200-20 MG/5ML PO SUSP: 30.0000 mL | ORAL | Status: DC | PRN

## 2013-02-20 NOTE — ED Notes (Signed)
Pt transferred from triage, presents SI, plan to hang self and HI, plan to break in someone's house steal a gun and shoot someone.  Pt reports the voices are telling him to do things he doesn't want to do.  Pt reports he has been hearing voices x 3 mos.  Feeling hopeless.   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 Admits to using street drugs.  Pt states he is a Alcoholic.  Reports diag with PTSD, Major Depression. Pt calm & cooperative at present.

## 2013-02-20 NOTE — ED Provider Notes (Signed)
CSN: [id number]     Arrival date & time 02/20/13  0429 History   First MD Initiated Contact with Patient 02/20/13 0503     Chief Complaint  Patient presents with  . Medical Clearance   (Consider location/radiation/quality/duration/timing/severity/associated sxs/prior Treatment) HPI 52 year old male presents emergency department accompanied by the police with reported auditory hallucinations, suicidal, and homicidal thoughts.  Patient reports he is very depressed.  He reports he has chronic pain from prior injuries.  Per his recollection, "I broke in every bone in my body".  He has a past history of polysubstance abuse.  He reports he has taken a few Vicodin.  Off the street, which has not helped his pain.  He thinks the voices have been ongoing for last 3 months, although he thinks he is always heard some voices since he was a child.  The voices over the last 3 months, however, have been normal and shows and urging him to do bad things.  He is concerned for his safety and the safety of those around him.  There is no specific person he is homicidal against. Past Medical History  Diagnosis Date  . Chronic pain   . Polysubstance abuse   . Back pain   . Depression   . Alcoholism   . Anxiety   . Migraine   . Arthritis     DDD  . Drug abuse 08/13/2012   Past Surgical History  Procedure Laterality Date  . Femur fracture surgery    . Fracture surgery    . Nasal septum surgery     Family History  Problem Relation Age of Onset  . Alcohol abuse Mother   . Alcohol abuse Father   . Heart disease Father     Pacemaker   History  Substance Use Topics  . Smoking status: Current Every Day Smoker -- 1.00 packs/day for 38 years    Types: Cigarettes  . Smokeless tobacco: Not on file  . Alcohol Use: Yes    Review of Systems  All other systems reviewed and are negative.   other than listed in history of present illness  Allergies  Metoclopramide  Home Medications   Current Outpatient Rx   Name  Route  Sig  Dispense  Refill  . FLUoxetine (PROZAC) 10 MG capsule   Oral   Take 1 capsule (10 mg total) by mouth daily. For depression   30 capsule   0   . gabapentin (NEURONTIN) 100 MG capsule   Oral   Take 2 capsules (200 mg total) by mouth 3 (three) times daily. For pain and anxiety   180 capsule   0   . nabumetone (RELAFEN) 500 MG tablet   Oral   Take 1 tablet (500 mg total) by mouth 2 (two) times daily. For pain control   60 tablet   0   . pantoprazole (PROTONIX) 20 MG tablet   Oral   Take 1 tablet (20 mg total) by mouth 2 (two) times daily. For acid reflux   60 tablet   0   . traZODone (DESYREL) 50 MG tablet   Oral   Take 1 tablet (50 mg total) by mouth at bedtime as needed and may repeat dose one time if needed for sleep.   30 tablet   0    BP 132/83  Pulse 69  Temp(Src) 98.6 F (37 C)  Resp 19  Ht 5\' 6"  (1.676 m)  Wt 167 lb (75.751 kg)  BMI 26.97 kg/m2  SpO2 98%  Physical Exam  Nursing note and vitals reviewed. Constitutional: He is oriented to person, place, and time. He appears well-developed and well-nourished.  HENT:  Head: Normocephalic and atraumatic.  Nose: Nose normal.  Mouth/Throat: Oropharynx is clear and moist.  Eyes: Conjunctivae and EOM are normal. Pupils are equal, round, and reactive to light.  Neck: Normal range of motion. Neck supple. No JVD present. No tracheal deviation present. No thyromegaly present.  Cardiovascular: Normal rate, regular rhythm, normal heart sounds and intact distal pulses.  Exam reveals no gallop and no friction rub.   No murmur heard. Pulmonary/Chest: Effort normal and breath sounds normal. No stridor. No respiratory distress. He has no wheezes. He has no rales. He exhibits no tenderness.  Abdominal: Soft. Bowel sounds are normal. He exhibits no distension and no mass. There is no tenderness. There is no rebound and no guarding.  Musculoskeletal: Normal range of motion. He exhibits no edema and no tenderness.   Lymphadenopathy:    He has no cervical adenopathy.  Neurological: He is alert and oriented to person, place, and time. No cranial nerve deficit. He exhibits normal muscle tone. Coordination normal.  Skin: Skin is dry. No rash noted. No erythema. No pallor.  Psychiatric: His behavior is normal.  Flat affect, appears depressed, poor insight and judgment    ED Course  Procedures (including critical care time) Labs Review Labs Reviewed  URINE RAPID DRUG SCREEN (HOSP PERFORMED) - Abnormal; Notable for the following:    Opiates POSITIVE (*)    All other components within normal limits  CBC WITH DIFFERENTIAL  BASIC METABOLIC PANEL  ETHANOL   Imaging Review No results found.  MDM   1. Major depressive disorder, recurrent episode, moderate   2. Auditory hallucinations   3. Suicidal ideations   4. Homicidal ideations    52 year old male with auditory hallucinations urging him to kill himself or hurt others.  Patient also with chronic pain and depression.  Will discuss with ACT/psychiatry for admission.  Patient specifically is [facility], as he reports when he was at behavioral Hospital, they did not treat his pain, and "i almost crawled out of my skin"   Olivia Mackie, MD 02/20/13 [phone]

## 2013-02-20 NOTE — Progress Notes (Signed)
Per discussion with psychiatrist, patient is psychiatrically stable for discharge home. CSW provided pt with outpatient resources including pain clinic, and mobile crisis. Patient stated he just needed someone to take care of his pain medicine. Pt thanked csw for time.   Catha Gosselin, LCSW [phone]  ED CSW .02/20/2013 1110am

## 2013-02-20 NOTE — ED Notes (Signed)
C/o pain in back, at home on opiates for pain, IBU is ordered and given for back pain 9/10

## 2013-02-20 NOTE — ED Provider Notes (Signed)
11:22 AM Pt has been evaluated by psych, once told he would not receive anything stronger than ibuprofen in [facility], he was no longer SI, wants to go home & f/u with PCP for referral to a pain specialist.  Pt also insists he has no SI/HI in conversation with myself.  Return precautions given for new or worsening symptoms  1. Major depressive disorder, recurrent episode, moderate   2. Auditory hallucinations   3. Suicidal ideations   4. Homicidal ideations   5. Chronic pain      Shanna Cisco, MD 02/20/13 1123

## 2013-02-20 NOTE — [facility] Counselor (Addendum)
Writer consulted with the nurse working with the patient Jared [name]) as well as the ER MD working with the patient.   Writer scheduled a Tele Assessment with the patient at 8:30am.

## 2013-02-20 NOTE — Consult Note (Addendum)
  Psychiatric Specialty Exam: @PHYSEXAMBYAGE2 @  @ROS @  Blood pressure 132/83, pulse 69, temperature 98.6 F (37 C), resp. rate 19, height 5\' 6"  (1.676 m), weight 75.751 kg (167 lb), SpO2 98.00%.Body mass index is 26.97 kg/(m^2).  General Appearance: Disheveled  Eye Solicitor::  Fair  Speech:  Slow and Slurred  Volume:  Decreased  Mood:  Irritable  Affect:  Congruent  Thought Process:  Goal Directed  Orientation:  Full (Time, Place, and Person)  Thought Content:  says he hears voices  Suicidal Thoughts:  Yes.  with intent/plan  Homicidal Thoughts:  No  Memory:  Immediate;   Fair Recent;   Fair Remote;   Fair  Judgement:  Impaired  Insight:  Lacking  Psychomotor Activity:  Normal  Concentration:  Fair  Recall:  Good  Akathisia:  Negative  Handed:  Right  AIMS (if indicated):     Assets:  Desire for Improvement  Sleep:  poor    Jared [name] is focused mostly on pain relief.  Says he wants help for the voices but unless the pain is addressed he could not tolerate the treatment in the hospital.  Says he does not want to go back to [facility] where he was treated in May because the pain treatment was not adequate.  Says he is still suicidal.  Recommend inpatient treatment for depression and detox.  Pt is irritable and wants to go but because of his suicidal threats I am reluctant to let him go.  Jared [name] now says he is not suicidal. " I was yesterday but I am not now.  I need to get help with the pain and I can get help for the voices from my therapis"t, he says.  I think he mainly wants pain medication and can accept that he is not suicidal now that he knows the pain will not be treated up to what he wants it to be.  Consequently, I recommend discharge today.

## 2013-02-20 NOTE — [facility] Counselor (Signed)
Writer informed the [facility] Office that the ER MD has submitted a request for a Tele Assessment.   

## 2013-02-20 NOTE — ED Notes (Signed)
Sheriff dispatched to house for call of pt wanting to harm himself; pt states he has been hearing voices for about three months; having si and hi thoughts; previous hospitalization for same in Feb per pt; pt flat affect; calm and cooperative

## 2013-02-20 NOTE — [facility] Counselor (Signed)
Writer consulted with CSW and she will be completing the assessment with the patient and Dr. Ladona Ridgel will be rounding on the patient as well.   Writer informed the nurse with working with the patient that the CSW and Dr. Ladona Ridgel would be assessing the patient.  Therefore, the scheduled TTS assessment will be cancelled.

## 2013-02-20 NOTE — BH Assessment (Signed)
Assessment Note  Jared [name] is an 52 y.o. male who presents to the ED with SI with plan to hang self or shoot self with gun. During assessment pt is very irritable and complaining of pain requesting medications. Pt wanted this Clinical research associate to come back later. Pt reports that he has AH with voices telling him to hurt others. Pt reports he's always had voices, but they have gotten worse. PT reports he lives alone in [place]. Pt reports he's been trying to get SSI, but has no current income. Pt reports he was seeing Dr. Haywood Pao at a pain clinic, however he felt that the patches they gave him were not working, and he was going through withdrawal from the Fentanyl patches not lasting as long as expected. Pt reports he tried to find a PCP who would manage his pain instead. Pt reports that he can't find a pcp due to no one wanting to take on new patients in [place] due to the damage Dr. Haywood Pao has done. Pt denies any other drug use or alcohol. Pt walked out of assessment and said he would be back. Patient was reluctant to speak with this writer and wanted to speak to the doctor about pain medication. Pt referred to psychiatrist to further evaluate to determine pt disposition.   Axis I: Major Depression, Recurrent severe, with psychotic features.  Axis II: Deferred Axis III:  Past Medical History  Diagnosis Date  . Chronic pain   . Polysubstance abuse   . Back pain   . Depression   . Alcoholism   . Anxiety   . Migraine   . Arthritis     DDD  . Drug abuse 08/13/2012   Axis IV: economic problems, occupational problems, other psychosocial or environmental problems, problems related to social environment, problems with access to health care services and problems with primary support group Axis V: 21-30 behavior considerably influenced by delusions or hallucinations OR serious impairment in judgment, communication OR inability to function in almost all areas  Past Medical History:  Past Medical History   Diagnosis Date  . Chronic pain   . Polysubstance abuse   . Back pain   . Depression   . Alcoholism   . Anxiety   . Migraine   . Arthritis     DDD  . Drug abuse 08/13/2012    Past Surgical History  Procedure Laterality Date  . Femur fracture surgery    . Fracture surgery    . Nasal septum surgery      Family History:  Family History  Problem Relation Age of Onset  . Alcohol abuse Mother   . Alcohol abuse Father   . Heart disease Father     Pacemaker    Social History:  reports that he has been smoking Cigarettes.  He has a 38 pack-year smoking history. He does not have any smokeless tobacco history on file. He reports that  drinks alcohol. He reports that he uses illicit drugs.  Additional Social History:     CIWA: CIWA-Ar BP: 132/83 mmHg Pulse Rate: 69 COWS:    Allergies:  Allergies  Allergen Reactions  . Metoclopramide Other (See Comments)    jittery    Home Medications:  (Not in a hospital admission)  OB/GYN Status:  No LMP for male patient.  General Assessment Data Location of Assessment: [facility] ED Is this a Tele or Face-to-Face Assessment?: Face-to-Face Is this an Initial Assessment or a Re-assessment for this encounter?: Initial Assessment Living Arrangements: Alone Can  pt return to current living arrangement?: Yes Admission Status: Voluntary Is patient capable of signing voluntary admission?: Yes Transfer from: Home Referral Source: Self/Family/Friend     [facility] Crisis Care Plan Living Arrangements: Alone  Education Status Is patient currently in school?: No Highest grade of school patient has completed: 7th  Risk to self Suicidal Ideation: Yes-Currently Present Suicidal Intent: Yes-Currently Present Is patient at risk for suicide?: Yes Suicidal Plan?: Yes-Currently Present Specify Current Suicidal Plan: yes Access to Means: Yes Specify Access to Suicidal Means: hang self or shoot self with gun  What has been your use of drugs/alcohol  within the last 12 months?: pain medication Previous Attempts/Gestures: No How many times?: 0 Other Self Harm Risks: no Triggers for Past Attempts: None known Intentional Self Injurious Behavior: None Family Suicide History: No Recent stressful life event(s): Other (Comment) (pain, unable to work ) Persecutory voices/beliefs?: No Depression: Yes Depression Symptoms: Despondent;Feeling worthless/self pity Substance abuse history and/or treatment for substance abuse?: Yes  Risk to Others Homicidal Ideation: Yes-Currently Present Thoughts of Harm to Others: Yes-Currently Present Comment - Thoughts of Harm to Others: no Current Homicidal Intent: Yes-Currently Present Current Homicidal Plan: No Access to Homicidal Means: Yes Describe Access to Homicidal Means: self Identified Victim: n/a History of harm to others?: No Assessment of Violence: None Noted Violent Behavior Description: none Does patient have access to weapons?:  Rich Reining) Criminal Charges Pending?:  Rich Reining) Does patient have a court date:  ([place])  Psychosis Hallucinations: Auditory;With command Delusions: None noted  Mental Status Report Appear/Hygiene: Disheveled Eye Contact: Poor Motor Activity: Freedom of movement Speech: Logical/coherent Level of Consciousness: Alert;Irritable Mood: Irritable Affect: Irritable Anxiety Level: Minimal Thought Processes: Coherent;Relevant Judgement: Unimpaired Orientation: Person;Place;Time;Situation Obsessive Compulsive Thoughts/Behaviors: None  Cognitive Functioning Concentration: Normal Memory: Recent Intact;Remote Intact IQ: Average Insight: Fair Impulse Control: Fair Appetite: Fair Sleep: No Change  ADLScreening [facility] Assessment Services) Patient's cognitive ability adequate to safely complete daily activities?: Yes Patient able to express need for assistance with ADLs?: Yes Independently performs ADLs?: Yes (appropriate for developmental age)  Prior Inpatient  Therapy Prior Inpatient Therapy: Yes Prior Therapy Dates: 2014  Prior Therapy [facility]ilty/Provider(s): [facility] Reason for Treatment: mdd, opiate abuse   Prior Outpatient Therapy Prior Outpatient Therapy: No  ADL Screening (condition at time of admission) Patient's cognitive ability adequate to safely complete daily activities?: Yes Patient able to express need for assistance with ADLs?: Yes Independently performs ADLs?: Yes (appropriate for developmental age)         Values / Beliefs Cultural Requests During Hospitalization: None Spiritual Requests During Hospitalization: None        Additional Information 1:1 In Past 12 Months?: No CIRT Risk: No Elopement Risk: No Does patient have medical clearance?: Yes     Disposition:  Disposition Initial Assessment Completed for this Encounter: Yes Disposition of Patient: Referred to  On Site Evaluation by:   Reviewed with Physician:    Catha Gosselin A 02/20/2013 8:52 AM

## 2013-03-06 ENCOUNTER — Encounter (HOSPITAL_COMMUNITY): Payer: Self-pay

## 2013-03-06 ENCOUNTER — Emergency Department (HOSPITAL_COMMUNITY)
Admission: EM | Admit: 2013-03-06 | Discharge: 2013-03-06 | Discharge status: Against Medical Advice | Payer: Medicaid Other | Attending: Emergency Medicine | Admitting: Emergency Medicine

## 2013-03-06 ENCOUNTER — Emergency Department (HOSPITAL_COMMUNITY): Payer: Medicaid Other

## 2013-03-06 DIAGNOSIS — Z8679 Personal history of other diseases of the circulatory system: Secondary | ICD-10-CM | POA: Insufficient documentation

## 2013-03-06 DIAGNOSIS — F1021 Alcohol dependence, in remission: Secondary | ICD-10-CM | POA: Insufficient documentation

## 2013-03-06 DIAGNOSIS — F3289 Other specified depressive episodes: Secondary | ICD-10-CM | POA: Insufficient documentation

## 2013-03-06 DIAGNOSIS — R112 Nausea with vomiting, unspecified: Secondary | ICD-10-CM | POA: Insufficient documentation

## 2013-03-06 DIAGNOSIS — M436 Torticollis: Secondary | ICD-10-CM | POA: Insufficient documentation

## 2013-03-06 DIAGNOSIS — F17200 Nicotine dependence, unspecified, uncomplicated: Secondary | ICD-10-CM | POA: Insufficient documentation

## 2013-03-06 DIAGNOSIS — M549 Dorsalgia, unspecified: Secondary | ICD-10-CM

## 2013-03-06 DIAGNOSIS — M129 Arthropathy, unspecified: Secondary | ICD-10-CM | POA: Insufficient documentation

## 2013-03-06 DIAGNOSIS — G8929 Other chronic pain: Secondary | ICD-10-CM | POA: Insufficient documentation

## 2013-03-06 DIAGNOSIS — R209 Unspecified disturbances of skin sensation: Secondary | ICD-10-CM | POA: Insufficient documentation

## 2013-03-06 DIAGNOSIS — F411 Generalized anxiety disorder: Secondary | ICD-10-CM | POA: Insufficient documentation

## 2013-03-06 DIAGNOSIS — F329 Major depressive disorder, single episode, unspecified: Secondary | ICD-10-CM | POA: Insufficient documentation

## 2013-03-06 DIAGNOSIS — M542 Cervicalgia: Secondary | ICD-10-CM | POA: Insufficient documentation

## 2013-03-06 DIAGNOSIS — M546 Pain in thoracic spine: Secondary | ICD-10-CM | POA: Insufficient documentation

## 2013-03-06 MED ORDER — IBUPROFEN 800 MG PO TABS
800.0000 mg | ORAL_TABLET | Freq: Once | ORAL | Status: AC
  Administered 2013-03-06: 800 mg via ORAL
  Filled 2013-03-06: qty 1

## 2013-03-06 MED ORDER — ACETAMINOPHEN 325 MG PO TABS
650.0000 mg | ORAL_TABLET | Freq: Once | ORAL | Status: AC
  Administered 2013-03-06: 650 mg via ORAL
  Filled 2013-03-06: qty 2

## 2013-03-06 NOTE — ED Notes (Addendum)
Per PTAR, Pt c/o chronic neck and back pain.  Sts he is in narcotic withdrawal, due to running out of meds x 4 days ago.  Sts "they were trying to get me an appointment at the pain clinic, but couldn't."  NAD noted.  A & Ox4.

## 2013-03-06 NOTE — ED Notes (Signed)
Pt left AMA at this time.  PA made aware.  Pt refused to sign at this time
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
## 2013-03-08 NOTE — ED Provider Notes (Signed)
Medical screening examination/treatment/procedure(s) were performed by non-physician practitioner and as supervising physician I was immediately available for consultation/collaboration.  Geoffery Lyons, MD 03/08/13 [phone]

## 2013-03-12 ENCOUNTER — Institutional Professional Consult (permissible substitution): Payer: Medicaid Other | Admitting: Diagnostic Neuroimaging

## 2013-03-18 ENCOUNTER — Institutional Professional Consult (permissible substitution): Payer: Medicaid Other | Admitting: Diagnostic Neuroimaging

## 2013-04-03 ENCOUNTER — Emergency Department (HOSPITAL_COMMUNITY)
Admission: EM | Admit: 2013-04-03 | Discharge: 2013-04-03 | Disposition: A | Discharge status: Home / Self Care | Payer: Medicaid Other | Attending: Emergency Medicine | Admitting: Emergency Medicine

## 2013-04-03 ENCOUNTER — Encounter (HOSPITAL_COMMUNITY): Payer: Self-pay | Admitting: Emergency Medicine

## 2013-04-03 ENCOUNTER — Inpatient Hospital Stay (HOSPITAL_COMMUNITY): Admission: AD | Admit: 2013-04-03 | Payer: Medicaid Other | Source: Intra-hospital

## 2013-04-03 DIAGNOSIS — F1110 Opioid abuse, uncomplicated: Secondary | ICD-10-CM | POA: Insufficient documentation

## 2013-04-03 DIAGNOSIS — R197 Diarrhea, unspecified: Secondary | ICD-10-CM | POA: Insufficient documentation

## 2013-04-03 DIAGNOSIS — R110 Nausea: Secondary | ICD-10-CM | POA: Insufficient documentation

## 2013-04-03 DIAGNOSIS — M542 Cervicalgia: Secondary | ICD-10-CM | POA: Insufficient documentation

## 2013-04-03 DIAGNOSIS — M546 Pain in thoracic spine: Secondary | ICD-10-CM | POA: Insufficient documentation

## 2013-04-03 DIAGNOSIS — Z8659 Personal history of other mental and behavioral disorders: Secondary | ICD-10-CM | POA: Insufficient documentation

## 2013-04-03 DIAGNOSIS — G8929 Other chronic pain: Secondary | ICD-10-CM | POA: Insufficient documentation

## 2013-04-03 DIAGNOSIS — F17200 Nicotine dependence, unspecified, uncomplicated: Secondary | ICD-10-CM | POA: Insufficient documentation

## 2013-04-03 DIAGNOSIS — Z8739 Personal history of other diseases of the musculoskeletal system and connective tissue: Secondary | ICD-10-CM | POA: Insufficient documentation

## 2013-04-03 DIAGNOSIS — Z8679 Personal history of other diseases of the circulatory system: Secondary | ICD-10-CM | POA: Insufficient documentation

## 2013-04-03 LAB — RAPID URINE DRUG SCREEN, HOSP PERFORMED: Opiates: NOT DETECTED

## 2013-04-03 LAB — CBC WITH DIFFERENTIAL/PLATELET
Basophils Absolute: 0 10*3/uL (ref 0.0–0.1)
Basophils Relative: 0 % (ref 0–1)
Eosinophils Absolute: 0.2 10*3/uL (ref 0.0–0.7)
Eosinophils Relative: 2 % (ref 0–5)
Lymphocytes Relative: 20 % (ref 12–46)
MCV: 87 fL (ref 78.0–100.0)
Platelets: 243 10*3/uL (ref 150–400)
RDW: 14.1 % (ref 11.5–15.5)
WBC: 11.7 10*3/uL — ABNORMAL HIGH (ref 4.0–10.5)

## 2013-04-03 LAB — BASIC METABOLIC PANEL
CO2: 24 mEq/L (ref 19–32)
Calcium: 9.3 mg/dL (ref 8.4–10.5)
GFR calc Af Amer: >90 mL/min (ref >90)
GFR calc non Af Amer: >90 mL/min (ref >90)
Sodium: 142 mEq/L (ref 135–145)

## 2013-04-03 LAB — ETHANOL: Alcohol, Ethyl (B): <11 mg/dL (ref 0–11)

## 2013-04-03 MED ORDER — CLONIDINE HCL 0.1 MG PO TABS: 0.1000 mg | ORAL_TABLET | Freq: Every day | ORAL | Status: DC

## 2013-04-03 MED ORDER — ONDANSETRON 4 MG PO TBDP: 4.0000 mg | ORAL_TABLET | Freq: Four times a day (QID) | ORAL | Status: DC | PRN

## 2013-04-03 MED ORDER — CLONIDINE HCL 0.2 MG PO TABS: 0.1000 mg | ORAL_TABLET | Freq: Two times a day (BID) | ORAL | Status: DC

## 2013-04-03 MED ORDER — IBUPROFEN 200 MG PO TABS: 600.0000 mg | ORAL_TABLET | Freq: Three times a day (TID) | ORAL | Status: DC | PRN

## 2013-04-03 MED ORDER — GABAPENTIN 300 MG PO CAPS
300.0000 mg | ORAL_CAPSULE | Freq: Three times a day (TID) | ORAL | Status: DC
  Administered 2013-04-03: 300 mg via ORAL
  Filled 2013-04-03: qty 1

## 2013-04-03 MED ORDER — NAPROXEN 250 MG PO TABS: 500.0000 mg | ORAL_TABLET | Freq: Two times a day (BID) | ORAL | Status: DC | PRN

## 2013-04-03 MED ORDER — ACETAMINOPHEN 325 MG PO TABS
650.0000 mg | ORAL_TABLET | ORAL | Status: DC | PRN
  Administered 2013-04-03 (×2): 650 mg via ORAL
  Filled 2013-04-03 (×2): qty 2

## 2013-04-03 MED ORDER — METHOCARBAMOL 500 MG PO TABS
500.0000 mg | ORAL_TABLET | Freq: Three times a day (TID) | ORAL | Status: DC | PRN
  Administered 2013-04-03: 500 mg via ORAL
  Filled 2013-04-03: qty 1

## 2013-04-03 MED ORDER — ZOLPIDEM TARTRATE 5 MG PO TABS: 5.0000 mg | ORAL_TABLET | Freq: Every evening | ORAL | Status: DC | PRN

## 2013-04-03 MED ORDER — DICYCLOMINE HCL 20 MG PO TABS: 20.0000 mg | ORAL_TABLET | Freq: Four times a day (QID) | ORAL | Status: DC | PRN

## 2013-04-03 MED ORDER — NICOTINE 14 MG/24HR TD PT24
14.0000 mg | MEDICATED_PATCH | Freq: Every day | TRANSDERMAL | Status: DC
  Administered 2013-04-03: 14 mg via TRANSDERMAL
  Filled 2013-04-03: qty 1

## 2013-04-03 MED ORDER — GABAPENTIN 300 MG PO CAPS: 300.0000 mg | ORAL_CAPSULE | Freq: Three times a day (TID) | ORAL | Status: DC

## 2013-04-03 MED ORDER — LOPERAMIDE HCL 2 MG PO CAPS: 2.0000 mg | ORAL_CAPSULE | ORAL | Status: DC | PRN

## 2013-04-03 MED ORDER — HYDROXYZINE HCL 25 MG PO TABS
25.0000 mg | ORAL_TABLET | Freq: Four times a day (QID) | ORAL | Status: DC | PRN
  Administered 2013-04-03: 25 mg via ORAL
  Filled 2013-04-03: qty 1

## 2013-04-03 MED ORDER — CLONIDINE HCL 0.1 MG PO TABS: 0.1000 mg | ORAL_TABLET | ORAL | Status: DC

## 2013-04-03 MED ORDER — ONDANSETRON HCL 4 MG PO TABS: 4.0000 mg | ORAL_TABLET | Freq: Three times a day (TID) | ORAL | Status: DC | PRN

## 2013-04-03 MED ORDER — LORAZEPAM 1 MG PO TABS
1.0000 mg | ORAL_TABLET | Freq: Three times a day (TID) | ORAL | Status: DC | PRN
  Administered 2013-04-03: 1 mg via ORAL
  Filled 2013-04-03: qty 1

## 2013-04-03 MED ORDER — ALUM & MAG HYDROXIDE-SIMETH 200-200-20 MG/5ML PO SUSP: 30.0000 mL | ORAL | Status: DC | PRN

## 2013-04-03 MED ORDER — CLONIDINE HCL 0.1 MG PO TABS
0.1000 mg | ORAL_TABLET | Freq: Four times a day (QID) | ORAL | Status: DC
  Administered 2013-04-03: 0.1 mg via ORAL
  Filled 2013-04-03: qty 1

## 2013-04-03 NOTE — ED Notes (Addendum)
Trying to d/c pt home.  Pt reports that he is SI and will go home and kill himself if d/c home.  EDP made aware.  [facility] assessment team also made aware.  Pt given a meal tray.  Continues to be in a monitored room.

## 2013-04-03 NOTE — ED Provider Notes (Signed)
CSN: [id number]     Arrival date & time 04/03/13  0051 History   First MD Initiated Contact with Patient 04/03/13 0127     Chief Complaint  Patient presents with  . Withdrawal   (Consider location/radiation/quality/duration/timing/severity/associated sxs/prior Treatment) The history is provided by the patient.  52 year-old male with history of chronic back and neck pain states it he had been on narcotics for his back but he stopped seeing his pain clinic in [facility] because of transportation issues. He has been getting his narcotics from friends but states that he has not had any for the last 2 days. He is complaining of pain in his neck, upper back, mid back with pain rated at 8/10. It is worse with movement or palpation. He states that he wants to get pain medication until he can get established with a new pain clinic. He has had some nausea and diarrhea which started over the last 24 hours. He denies fever or chills. He does state that Suboxone had been reasonably effective for him in the past and also Neurontin.  Past Medical History  Diagnosis Date  . Chronic pain   . Polysubstance abuse   . Back pain   . Depression   . Alcoholism   . Anxiety   . Migraine   . Arthritis     DDD  . Drug abuse 08/13/2012   Past Surgical History  Procedure Laterality Date  . Femur fracture surgery    . Fracture surgery    . Nasal septum surgery     Family History  Problem Relation Age of Onset  . Alcohol abuse Mother   . Alcohol abuse Father   . Heart disease Father     Pacemaker   History  Substance Use Topics  . Smoking status: Current Every Day Smoker -- 1.00 packs/day for 38 years    Types: Cigarettes  . Smokeless tobacco: Not on file  . Alcohol Use: Yes    Review of Systems  All other systems reviewed and are negative.    Allergies  Metoclopramide and Prozac  Home Medications  No current outpatient prescriptions on file. BP 112/92  Pulse 78  Temp(Src) 98 F (36.7 C)  (Oral)  Resp 18  SpO2 99% Physical Exam  Nursing note and vitals reviewed.  52 year old male, resting comfortably and in no acute distress. Vital signs are normal. Oxygen saturation is 99%, which is normal. Head is normocephalic and atraumatic. PERRLA, EOMI. Oropharynx is clear. Neck is mildly to moderately tender diffusely. Neck is supple without adenopathy or JVD. Back is moderately tender in the mid to lower thoracic region with milder tenderness in the upper thoracic and lumbar area. There is no CVA tenderness. Lungs are clear without rales, wheezes, or rhonchi. Chest is nontender. Heart has regular rate and rhythm without murmur. Abdomen is soft, flat, nontender without masses or hepatosplenomegaly and peristalsis is normoactive. Extremities have no cyanosis or edema, full range of motion is present. Skin is warm and dry without rash. Neurologic: Mental status is normal, cranial nerves are intact, there are no motor or sensory deficits.  ED Course  Procedures (including critical care time) Labs Review Results for orders placed during the hospital encounter of 04/03/13  CBC WITH DIFFERENTIAL      Result Value Range   WBC 11.7 (*) 4.0 - 10.5 K/uL   RBC 5.08  4.22 - 5.81 MIL/uL   Hemoglobin 15.2  13.0 - 17.0 g/dL   HCT 44.2  39.0 - 52.0 %   MCV 87.0  78.0 - 100.0 fL   MCH 29.9  26.0 - 34.0 pg   MCHC 34.4  30.0 - 36.0 g/dL   RDW 14.7  82.9 - 56.2 %   Platelets 243  150 - 400 K/uL   Neutrophils Relative % 71  43 - 77 %   Neutro Abs 8.2 (*) 1.7 - 7.7 K/uL   Lymphocytes Relative 20  12 - 46 %   Lymphs Abs 2.4  0.7 - 4.0 K/uL   Monocytes Relative 7  3 - 12 %   Monocytes Absolute 0.8  0.1 - 1.0 K/uL   Eosinophils Relative 2  0 - 5 %   Eosinophils Absolute 0.2  0.0 - 0.7 K/uL   Basophils Relative 0  0 - 1 %   Basophils Absolute 0.0  0.0 - 0.1 K/uL  BASIC METABOLIC PANEL      Result Value Range   Sodium 142  135 - 145 mEq/L   Potassium 4.1  3.5 - 5.1 mEq/L   Chloride 107  96  - 112 mEq/L   CO2 24  19 - 32 mEq/L   Glucose, Bld 124 (*) 70 - 99 mg/dL   BUN 13  6 - 23 mg/dL   Creatinine, Ser 1.30  0.50 - 1.35 mg/dL   Calcium 9.3  8.4 - 86.5 mg/dL   GFR calc non Af Amer >90  >90 mL/min   GFR calc Af Amer >90  >90 mL/min  ETHANOL      Result Value Range   Alcohol, Ethyl (B) <11  0 - 11 mg/dL  URINE RAPID DRUG SCREEN (HOSP PERFORMED)      Result Value Range   Opiates NONE DETECTED  NONE DETECTED   Cocaine NONE DETECTED  NONE DETECTED   Benzodiazepines POSITIVE (*) NONE DETECTED   Amphetamines NONE DETECTED  NONE DETECTED   Tetrahydrocannabinol NONE DETECTED  NONE DETECTED   Barbiturates NONE DETECTED  NONE DETECTED   MDM   1. Opioid abuse    Exacerbation of chronic neck and back pain. Old records are reviewed and he does have a history of herniated cervical discs and compression fracture of T9. Current symptoms are consistent with narcotic withdrawal. I have informed him that he would not get narcotic prescriptions from you but I would be willing to have him work with social service regarding placement for detox from narcotics. Patient is agreeable to that and he is admitted to the psychiatric holding area on clonidine detox protocol.   Dione Booze, MD 04/04/13 [phone]

## 2013-04-03 NOTE — ED Provider Notes (Addendum)
Patient was assessed by TTS. It was felt he was safe for discharge. He is currently not exhibiting significant signs of withdrawal. He was discharged and was given referrals for outpatient followup for pain management by [facility].  Rolan Bucco, MD 04/03/13 1254  13:59 patient on discharge states that he was having suicidal ideations. He previously denied this to multiple personal. He was reevaluated by TTS. He was advised by them that he would not be getting narcotic medications in the hospital. He now states that he's not suicidal. I evaluated him personally and he is adamantly denying any suicidal ideations. He has an outpatient plan for followup. He also had expressed several times to previous personel he was not having thoughts of suicide cycle a.c. for discharge.  Rolan Bucco, MD 04/03/13 [phone]

## 2013-04-03 NOTE — ED Notes (Signed)
Spoke with Quenten Raven at [facility], she advised that teleassessment is scheduled for patient at 0830 with Jared [name].  Teleassess machine is set up in patient's room awaiting call in from [facility].

## 2013-04-03 NOTE — ED Notes (Signed)
Latoya from [facility] called to move pt telepsych to 0930.

## 2013-04-03 NOTE — ED Notes (Signed)
Security into wand pt 

## 2013-04-03 NOTE — Treatment Plan (Signed)
Pt. Notified that he would be accepted to [facility] for his SI, but that his pain will managed without narcotics.  Pt. Verbalized understanding that he would not receive any narcotic pain meds.  Accepted byDr.  Dub Mikes to room 300-2.

## 2013-04-03 NOTE — BH Assessment (Signed)
Writer consulted with Dr. Dub Mikes regarding the patient not meeting criteria for inpatient hospitalization.  Writer informed the disposition Tech and she will be giving the patient outpatient referrals for Methadone and [place] [facility] facilities before the patient is discharged.   Writer informed the nurse working with the patient that the patient can be discharged with his outpatient referrals.   The nurse working with the patient reports that she will inform the ER MD about the patient.

## 2013-04-03 NOTE — ED Provider Notes (Signed)
Pt without issues this am.  Sitting up in bed eating breakfast  Filed Vitals:   04/03/13 0918  BP: 112/81  Pulse: 88  Temp:   Resp:      Rolan Bucco, MD 04/03/13 [phone]

## 2013-04-03 NOTE — ED Notes (Signed)
Went into patient's room to see if pt needed anything, pt now states that he is not having suicidal ideation. Pt states "I want to go to [facility], I know if I go to Behavioral health that I will not get the pain management I want." EDP made aware. Pt has sitter at bedside.

## 2013-04-03 NOTE — ED Notes (Signed)
Per ems-- pt reports painful withdrawls from OxyContin. Vs wnl.

## 2013-04-03 NOTE — BH Assessment (Signed)
Tele-Assessment Note  Patient requests referrals for opiate treatment facilities.  Patient reports SI without a plan.  Patient reports that when he is in pain he feels as if he wants to harm himself.  Patient reports that his last use was yesterday in which he took (6) 10mg  of oxycoton.  Patient reports that he has been using a pain medication doctor in the past and he is in need of another pain management doctor.   Patient denies HI.  Patient denies psychosis.  Patient denies medication management for any mental type of mental illness.  Patient reports a prior psychiatric diagnosis of Major Depressive Disorder.  Patient reports a prior history of psychiatric hospitalizations.   Axis I: Major Depression, Recurrent severe Opiate Dependence  Axis II: Deferred Axis III:  Past Medical History  Diagnosis Date  . Chronic pain   . Polysubstance abuse   . Back pain   . Depression   . Alcoholism   . Anxiety   . Migraine   . Arthritis     DDD  . Drug abuse 08/13/2012   Axis IV: economic problems, occupational problems, other psychosocial or environmental problems, problems related to social environment, problems with access to health care services and problems with primary support group Axis V: 31-40 impairment in reality testing  Past Medical History:  Past Medical History  Diagnosis Date  . Chronic pain   . Polysubstance abuse   . Back pain   . Depression   . Alcoholism   . Anxiety   . Migraine   . Arthritis     DDD  . Drug abuse 08/13/2012    Past Surgical History  Procedure Laterality Date  . Femur fracture surgery    . Fracture surgery    . Nasal septum surgery      Family History:  Family History  Problem Relation Age of Onset  . Alcohol abuse Mother   . Alcohol abuse Father   . Heart disease Father     Pacemaker    Social History:  reports that he has been smoking Cigarettes.  He has a 38 pack-year smoking history. He does not have any smokeless tobacco history on  file. He reports that he drinks alcohol. He reports that he uses illicit drugs.  Additional Social History:     CIWA: CIWA-Ar BP: 143/86 mmHg Pulse Rate: 88 Nausea and Vomiting: mild nausea with no vomiting Tactile Disturbances: none Tremor: no tremor Auditory Disturbances: not present Paroxysmal Sweats: no sweat visible Visual Disturbances: not present Anxiety: mildly anxious Headache, Fullness in Head: none present Agitation: normal activity Orientation and Clouding of Sensorium: oriented and can do serial additions CIWA-Ar Total: 2 COWS:    Allergies:  Allergies  Allergen Reactions  . Metoclopramide Other (See Comments)    jittery  . Prozac [Fluoxetine Hcl]     Pt states he got diaphoretic from medicine and disturbed his thinking.     Home Medications:  (Not in a hospital admission)  OB/GYN Status:  No LMP for male patient.  General Assessment Data Location of Assessment: [facility] Services Is this a Tele or Face-to-Face Assessment?: Tele Assessment Is this an Initial Assessment or a Re-assessment for this encounter?: Initial Assessment Living Arrangements: Alone Can pt return to current living arrangement?: Yes Admission Status: Voluntary Is patient capable of signing voluntary admission?: Yes Transfer from: Acute Hospital Referral Source: Self/Family/Friend  Medical Screening Exam [facility] Walk-in ONLY) Medical Exam completed: Yes  [facility] Crisis Care Plan Living Arrangements: Alone  Education Status Is patient currently in school?: No  Risk to self Suicidal Ideation: Yes-Currently Present Suicidal Intent: No-Not Currently/Within Last 6 Months Is patient at risk for suicide?: Yes Suicidal Plan?: No-Not Currently/Within Last 6 Months Specify Current Suicidal Plan: None Access to Means: No Specify Access to Suicidal Means: None What has been your use of drugs/alcohol within the last 12 months?: opiates Previous Attempts/Gestures: Yes How many times?:  1 Other Self Harm Risks: None Triggers for Past Attempts: None known Intentional Self Injurious Behavior: None Family Suicide History: No Recent stressful life event(s): Other (Comment) Persecutory voices/beliefs?: No Depression: Yes Depression Symptoms: Fatigue;Loss of interest in usual pleasures;Feeling worthless/self pity;Feeling angry/irritable Substance abuse history and/or treatment for substance abuse?: Yes Suicide prevention information given to non-admitted patients: Yes  Risk to Others Homicidal Ideation: No Thoughts of Harm to Others: No Comment - Thoughts of Harm to Others: None Current Homicidal Intent: No Current Homicidal Plan: No Access to Homicidal Means: No Describe Access to Homicidal Means: None Identified Victim: None History of harm to others?: No Assessment of Violence: None Noted Violent Behavior Description: calm Does patient have access to weapons?: No Criminal Charges Pending?: No Does patient have a court date: No  Psychosis Hallucinations: None noted Delusions: None noted  Mental Status Report Appear/Hygiene: Disheveled Eye Contact: Fair Motor Activity: Freedom of movement Speech: Logical/coherent Level of Consciousness: Alert;Irritable Mood: Depressed;Despair Affect: Irritable Anxiety Level: None Thought Processes: Coherent;Relevant Judgement: Unimpaired Orientation: Person;Place;Time;Situation Obsessive Compulsive Thoughts/Behaviors: None  Cognitive Functioning Concentration: Decreased Memory: Recent Intact;Remote Impaired IQ: Average Insight: Fair Impulse Control: Poor Appetite: Fair Weight Loss: 0 Weight Gain: 0 Sleep: Decreased Total Hours of Sleep: 5 Vegetative Symptoms: Decreased grooming;Not bathing  ADLScreening [facility] Assessment Services) Patient's cognitive ability adequate to safely complete daily activities?: Yes Patient able to express need for assistance with ADLs?: Yes Independently performs ADLs?: Yes  (appropriate for developmental age)  Prior Inpatient Therapy Prior Inpatient Therapy: Yes Prior Therapy Dates: 2014  [facility]ilty[facility]Provider([facility]): [facility] Reason for Treatment: mdd, opiate abuse   Prior Outpatient Therapy Prior Outpatient Therapy: No Prior Therapy Dates: na Prior Therapy Facilty/Provider(s): na Reason for Treatment: SI and substance abuse   ADL Screening (condition at time of admission) Patient's cognitive ability adequate to safely complete daily activities?: Yes Patient able to express need for assistance with ADLs?: Yes Independently performs ADLs?: Yes (appropriate for developmental age)         Values / Beliefs Cultural Requests During Hospitalization: None Spiritual Requests During Hospitalization: None        Additional Information 1:1 In Past 12 Months?: No CIRT Risk: No Elopement Risk: No Does patient have medical clearance?: Yes     Disposition:  Disposition Initial Assessment Completed for this Encounter: Yes Disposition of Patient: Referred to Patient referred to: Other (Comment)  On Site Evaluation by:   Reviewed with Physician:    Phillip Heal LaVerne 04/03/2013 11:55 AM

## 2013-04-03 NOTE — ED Notes (Signed)
House coverage notified for sitter.  

## 2013-04-03 NOTE — ED Notes (Signed)
Spoke with Tanna Savoy at [facility].  Reports that since pt reports that he was SI, pt can be admitted to [facility] but is not allowed to have narcotics pain medication.  Pt reports that it is fine, he will deal with the pain.  Pt assigned to room 300-2.  Report to be given in 30 minutes.  Pt then reports that he is no longer SI and that he wants to go home.  Minerva Areola called back, advised that attending here needs to see the pt and determine that he is not SI and document so.  Dr. Fredderick Phenix at bedside.  Pt denies SI and HI and reports that he is going to see his PCP and get his medication tomorrow.  Pt d/c with all of his belongings and d/c paperwork.

## 2013-04-03 NOTE — ED Notes (Signed)
Pt reports he has nerve pain in back which is why he started taking OxyContin. He has been taking pills heavily for approx 5 years. Pt states he is in between pain clinics and gets his pills off the street. Pt reports he can't hold a job due to chronic pain.

## 2013-04-03 NOTE — ED Notes (Signed)
Ava from behavioral health, telepsych appt. At 0830

## 2013-04-03 NOTE — Progress Notes (Signed)
Provided patient with a list of residential treatment programs ([facility] and [facility] was highlighted and instructions for making appointments were explained), individual therapists for treatment for addictions, psychiatrists, and mobile crisis teams. Patient stated that he was not ready to go home as "I am still detoxing." Patient RN, Florentina Addison, ws informed about patient's concerns. Rodman Pickle, MHT

## 2013-04-04 ENCOUNTER — Emergency Department: Payer: Self-pay | Admitting: Emergency Medicine

## 2013-04-04 LAB — CBC
HCT: 42 % (ref 40.0–52.0)
HGB: 14.4 g/dL (ref 13.0–18.0)
MCH: 29.7 pg (ref 26.0–34.0)
MCV: 86 fL (ref 80–100)
Platelet: 274 10*3/uL (ref 150–440)
RBC: 4.86 10*6/uL (ref 4.40–5.90)
RDW: 14.7 % — ABNORMAL HIGH (ref 11.5–14.5)
WBC: 13.9 10*3/uL — ABNORMAL HIGH (ref 3.8–10.6)

## 2013-04-04 LAB — ETHANOL
Ethanol %: <0.003 % (ref 0.000–0.080)
Ethanol: <3 mg/dL

## 2013-04-04 LAB — URINALYSIS, COMPLETE
Bilirubin,UR: NEGATIVE
Glucose,UR: NEGATIVE mg/dL (ref 0–75)
Ketone: NEGATIVE
Leukocyte Esterase: NEGATIVE
Ph: 5 (ref 4.5–8.0)
Protein: NEGATIVE
RBC,UR: 3 /HPF (ref 0–5)
Specific Gravity: 1.006 (ref 1.003–1.030)
Squamous Epithelial: NONE SEEN
WBC UR: 1 /HPF (ref 0–5)

## 2013-04-04 LAB — COMPREHENSIVE METABOLIC PANEL
Alkaline Phosphatase: 85 U/L (ref 50–136)
Anion Gap: 6 — ABNORMAL LOW (ref 7–16)
BUN: 12 mg/dL (ref 7–18)
Bilirubin,Total: 0.5 mg/dL (ref 0.2–1.0)
Calcium, Total: 8.7 mg/dL (ref 8.5–10.1)
Chloride: 108 mmol/L — ABNORMAL HIGH (ref 98–107)
Co2: 26 mmol/L (ref 21–32)
EGFR (African American): >60
EGFR (Non-African Amer.): >60
Osmolality: 279 (ref 275–301)
Potassium: 3.5 mmol/L (ref 3.5–5.1)
SGPT (ALT): 28 U/L (ref 12–78)
Sodium: 140 mmol/L (ref 136–145)
Total Protein: 7 g/dL (ref 6.4–8.2)

## 2013-04-04 LAB — TSH: Thyroid Stimulating Horm: 0.18 u[IU]/mL — ABNORMAL LOW

## 2013-04-04 LAB — DRUG SCREEN, URINE
Barbiturates, Ur Screen: NEGATIVE (ref ?–200)
Cannabinoid 50 Ng, Ur ~~LOC~~: NEGATIVE (ref ?–50)

## 2013-08-20 ENCOUNTER — Emergency Department (HOSPITAL_COMMUNITY)
Admission: EM | Admit: 2013-08-20 | Discharge: 2013-08-21 | Disposition: A | Discharge status: Other Acute Inpatient Hospital | Payer: Medicaid Other | Attending: Emergency Medicine | Admitting: Emergency Medicine

## 2013-08-20 ENCOUNTER — Encounter (HOSPITAL_COMMUNITY): Payer: Self-pay | Admitting: Emergency Medicine

## 2013-08-20 DIAGNOSIS — G8921 Chronic pain due to trauma: Secondary | ICD-10-CM | POA: Insufficient documentation

## 2013-08-20 DIAGNOSIS — Z87828 Personal history of other (healed) physical injury and trauma: Secondary | ICD-10-CM | POA: Insufficient documentation

## 2013-08-20 DIAGNOSIS — Z8679 Personal history of other diseases of the circulatory system: Secondary | ICD-10-CM | POA: Insufficient documentation

## 2013-08-20 DIAGNOSIS — F1910 Other psychoactive substance abuse, uncomplicated: Secondary | ICD-10-CM

## 2013-08-20 DIAGNOSIS — F19939 Other psychoactive substance use, unspecified with withdrawal, unspecified: Secondary | ICD-10-CM | POA: Insufficient documentation

## 2013-08-20 DIAGNOSIS — R45851 Suicidal ideations: Secondary | ICD-10-CM | POA: Insufficient documentation

## 2013-08-20 DIAGNOSIS — F419 Anxiety disorder, unspecified: Secondary | ICD-10-CM

## 2013-08-20 DIAGNOSIS — M79609 Pain in unspecified limb: Secondary | ICD-10-CM | POA: Insufficient documentation

## 2013-08-20 DIAGNOSIS — F1310 Sedative, hypnotic or anxiolytic abuse, uncomplicated: Secondary | ICD-10-CM | POA: Insufficient documentation

## 2013-08-20 DIAGNOSIS — F1120 Opioid dependence, uncomplicated: Secondary | ICD-10-CM | POA: Insufficient documentation

## 2013-08-20 DIAGNOSIS — M129 Arthropathy, unspecified: Secondary | ICD-10-CM | POA: Insufficient documentation

## 2013-08-20 DIAGNOSIS — F411 Generalized anxiety disorder: Secondary | ICD-10-CM | POA: Insufficient documentation

## 2013-08-20 DIAGNOSIS — F17200 Nicotine dependence, unspecified, uncomplicated: Secondary | ICD-10-CM | POA: Insufficient documentation

## 2013-08-20 DIAGNOSIS — F331 Major depressive disorder, recurrent, moderate: Secondary | ICD-10-CM | POA: Insufficient documentation

## 2013-08-20 DIAGNOSIS — F329 Major depressive disorder, single episode, unspecified: Secondary | ICD-10-CM

## 2013-08-20 DIAGNOSIS — IMO0002 Reserved for concepts with insufficient information to code with codable children: Secondary | ICD-10-CM | POA: Insufficient documentation

## 2013-08-20 DIAGNOSIS — G8929 Other chronic pain: Secondary | ICD-10-CM

## 2013-08-20 DIAGNOSIS — M7918 Myalgia, other site: Secondary | ICD-10-CM

## 2013-08-20 DIAGNOSIS — F3289 Other specified depressive episodes: Secondary | ICD-10-CM

## 2013-08-20 LAB — COMPREHENSIVE METABOLIC PANEL
ALBUMIN: 4.3 g/dL (ref 3.5–5.2)
ALT: 41 U/L (ref 0–53)
AST: 23 U/L (ref 0–37)
Alkaline Phosphatase: 150 U/L — ABNORMAL HIGH (ref 39–117)
BUN: 13 mg/dL (ref 6–23)
CALCIUM: 9.8 mg/dL (ref 8.4–10.5)
CO2: 21 mEq/L (ref 19–32)
CREATININE: 0.65 mg/dL (ref 0.50–1.35)
Chloride: 102 mEq/L (ref 96–112)
GFR calc non Af Amer: >90 mL/min (ref >90)
Glucose, Bld: 147 mg/dL — ABNORMAL HIGH (ref 70–99)
Potassium: 3.2 mEq/L — ABNORMAL LOW (ref 3.7–5.3)
Sodium: 140 mEq/L (ref 137–147)
TOTAL PROTEIN: 8.3 g/dL (ref 6.0–8.3)
Total Bilirubin: 0.2 mg/dL — ABNORMAL LOW (ref 0.3–1.2)

## 2013-08-20 LAB — RAPID URINE DRUG SCREEN, HOSP PERFORMED
AMPHETAMINES: NOT DETECTED
BENZODIAZEPINES: POSITIVE — AB
Barbiturates: NOT DETECTED
COCAINE: NOT DETECTED
Opiates: POSITIVE — AB
Tetrahydrocannabinol: NOT DETECTED

## 2013-08-20 LAB — CBC
HEMATOCRIT: 45.9 % (ref 39.0–52.0)
Hemoglobin: 15.8 g/dL (ref 13.0–17.0)
MCH: 28.8 pg (ref 26.0–34.0)
MCHC: 34.4 g/dL (ref 30.0–36.0)
MCV: 83.8 fL (ref 78.0–100.0)
Platelets: 306 10*3/uL (ref 150–400)
RBC: 5.48 MIL/uL (ref 4.22–5.81)
RDW: 14.5 % (ref 11.5–15.5)
WBC: 7.1 10*3/uL (ref 4.0–10.5)

## 2013-08-20 LAB — ETHANOL

## 2013-08-20 MED ORDER — ZOLPIDEM TARTRATE 5 MG PO TABS
5.0000 mg | ORAL_TABLET | Freq: Every evening | ORAL | Status: DC | PRN
  Administered 2013-08-20: 5 mg via ORAL
  Filled 2013-08-20: qty 1

## 2013-08-20 MED ORDER — ONDANSETRON HCL 4 MG PO TABS
4.0000 mg | ORAL_TABLET | Freq: Three times a day (TID) | ORAL | Status: DC | PRN
  Administered 2013-08-21: 4 mg via ORAL
  Filled 2013-08-20: qty 1

## 2013-08-20 MED ORDER — ACETAMINOPHEN 325 MG PO TABS
650.0000 mg | ORAL_TABLET | ORAL | Status: DC | PRN
  Administered 2013-08-20: 650 mg via ORAL
  Filled 2013-08-20: qty 2

## 2013-08-20 MED ORDER — NICOTINE 21 MG/24HR TD PT24
21.0000 mg | MEDICATED_PATCH | Freq: Every day | TRANSDERMAL | Status: DC
  Administered 2013-08-20 – 2013-08-21 (×2): 21 mg via TRANSDERMAL
  Filled 2013-08-20 (×2): qty 1

## 2013-08-20 MED ORDER — LORAZEPAM 1 MG PO TABS
1.0000 mg | ORAL_TABLET | Freq: Three times a day (TID) | ORAL | Status: DC | PRN
  Administered 2013-08-20 – 2013-08-21 (×3): 1 mg via ORAL
  Filled 2013-08-20 (×3): qty 1

## 2013-08-20 MED ORDER — IBUPROFEN 800 MG PO TABS
800.0000 mg | ORAL_TABLET | Freq: Once | ORAL | Status: AC
  Administered 2013-08-20: 800 mg via ORAL
  Filled 2013-08-20: qty 1
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
## 2013-08-21 ENCOUNTER — Encounter (HOSPITAL_COMMUNITY): Payer: Self-pay

## 2013-08-21 ENCOUNTER — Inpatient Hospital Stay (HOSPITAL_COMMUNITY)
Admission: AD | Admit: 2013-08-21 | Discharge: 2013-08-30 | DRG: 885 | Disposition: A | Discharge status: Home / Self Care | Payer: Medicaid Other | Source: Intra-hospital | Attending: Psychiatry | Admitting: Psychiatry

## 2013-08-21 DIAGNOSIS — M7918 Myalgia, other site: Secondary | ICD-10-CM

## 2013-08-21 DIAGNOSIS — F1994 Other psychoactive substance use, unspecified with psychoactive substance-induced mood disorder: Secondary | ICD-10-CM | POA: Diagnosis present

## 2013-08-21 DIAGNOSIS — IMO0001 Reserved for inherently not codable concepts without codable children: Secondary | ICD-10-CM | POA: Diagnosis present

## 2013-08-21 DIAGNOSIS — F17200 Nicotine dependence, unspecified, uncomplicated: Secondary | ICD-10-CM | POA: Diagnosis present

## 2013-08-21 DIAGNOSIS — Z8249 Family history of ischemic heart disease and other diseases of the circulatory system: Secondary | ICD-10-CM

## 2013-08-21 DIAGNOSIS — F411 Generalized anxiety disorder: Secondary | ICD-10-CM | POA: Diagnosis present

## 2013-08-21 DIAGNOSIS — G8929 Other chronic pain: Secondary | ICD-10-CM | POA: Diagnosis present

## 2013-08-21 DIAGNOSIS — R45851 Suicidal ideations: Secondary | ICD-10-CM

## 2013-08-21 DIAGNOSIS — F329 Major depressive disorder, single episode, unspecified: Secondary | ICD-10-CM | POA: Diagnosis present

## 2013-08-21 DIAGNOSIS — F3289 Other specified depressive episodes: Secondary | ICD-10-CM | POA: Diagnosis present

## 2013-08-21 DIAGNOSIS — F1120 Opioid dependence, uncomplicated: Secondary | ICD-10-CM | POA: Diagnosis present

## 2013-08-21 DIAGNOSIS — F331 Major depressive disorder, recurrent, moderate: Principal | ICD-10-CM

## 2013-08-21 DIAGNOSIS — M129 Arthropathy, unspecified: Secondary | ICD-10-CM | POA: Diagnosis present

## 2013-08-21 MED ORDER — MAGNESIUM HYDROXIDE 400 MG/5ML PO SUSP: 30.0000 mL | Freq: Every day | ORAL | Status: DC | PRN

## 2013-08-21 MED ORDER — TRAZODONE HCL 100 MG PO TABS: 100.0000 mg | ORAL_TABLET | Freq: Every day | ORAL | Status: DC

## 2013-08-21 MED ORDER — NICOTINE 21 MG/24HR TD PT24
21.0000 mg | MEDICATED_PATCH | Freq: Every day | TRANSDERMAL | Status: DC
  Administered 2013-08-22 – 2013-08-29 (×5): 21 mg via TRANSDERMAL
  Filled 2013-08-21 (×12): qty 1

## 2013-08-21 MED ORDER — CLONIDINE HCL 0.1 MG PO TABS
0.1000 mg | ORAL_TABLET | Freq: Four times a day (QID) | ORAL | Status: DC
  Administered 2013-08-21 – 2013-08-22 (×3): 0.1 mg via ORAL
  Filled 2013-08-21 (×10): qty 1

## 2013-08-21 MED ORDER — CLONIDINE HCL 0.1 MG PO TABS: 0.1000 mg | ORAL_TABLET | ORAL | Status: DC

## 2013-08-21 MED ORDER — HYDROXYZINE HCL 25 MG PO TABS
25.0000 mg | ORAL_TABLET | Freq: Four times a day (QID) | ORAL | Status: AC | PRN
  Administered 2013-08-22 – 2013-08-26 (×11): 25 mg via ORAL
  Filled 2013-08-21 (×12): qty 1

## 2013-08-21 MED ORDER — GABAPENTIN 300 MG PO CAPS
300.0000 mg | ORAL_CAPSULE | Freq: Three times a day (TID) | ORAL | Status: DC
  Administered 2013-08-22 – 2013-08-25 (×11): 300 mg via ORAL
  Filled 2013-08-21 (×18): qty 1

## 2013-08-21 MED ORDER — IBUPROFEN 200 MG PO TABS
600.0000 mg | ORAL_TABLET | Freq: Once | ORAL | Status: AC
  Administered 2013-08-21: 600 mg via ORAL
  Filled 2013-08-21: qty 3

## 2013-08-21 MED ORDER — METHOCARBAMOL 500 MG PO TABS
500.0000 mg | ORAL_TABLET | Freq: Three times a day (TID) | ORAL | Status: AC | PRN
  Administered 2013-08-23: 500 mg via ORAL
  Filled 2013-08-21: qty 1

## 2013-08-21 MED ORDER — GABAPENTIN 300 MG PO CAPS
300.0000 mg | ORAL_CAPSULE | Freq: Three times a day (TID) | ORAL | Status: DC
  Administered 2013-08-21: 300 mg via ORAL
  Filled 2013-08-21 (×2): qty 1

## 2013-08-21 MED ORDER — ONDANSETRON 4 MG PO TBDP
4.0000 mg | ORAL_TABLET | Freq: Four times a day (QID) | ORAL | Status: AC | PRN
  Administered 2013-08-25: 4 mg via ORAL
  Filled 2013-08-21: qty 1

## 2013-08-21 MED ORDER — NAPROXEN 500 MG PO TABS
500.0000 mg | ORAL_TABLET | Freq: Two times a day (BID) | ORAL | Status: AC | PRN
  Administered 2013-08-24 – 2013-08-25 (×2): 500 mg via ORAL
  Filled 2013-08-21 (×2): qty 1

## 2013-08-21 MED ORDER — LOPERAMIDE HCL 2 MG PO CAPS: 2.0000 mg | ORAL_CAPSULE | ORAL | Status: AC | PRN

## 2013-08-21 MED ORDER — TRAZODONE HCL 100 MG PO TABS
100.0000 mg | ORAL_TABLET | Freq: Every day | ORAL | Status: DC
  Administered 2013-08-21 – 2013-08-29 (×9): 100 mg via ORAL
  Filled 2013-08-21 (×12): qty 1

## 2013-08-21 MED ORDER — CLONIDINE HCL 0.1 MG PO TABS: 0.1000 mg | ORAL_TABLET | Freq: Every day | ORAL | Status: DC

## 2013-08-21 MED ORDER — DICYCLOMINE HCL 20 MG PO TABS: 20.0000 mg | ORAL_TABLET | Freq: Four times a day (QID) | ORAL | Status: AC | PRN

## 2013-08-21 MED ORDER — ALUM & MAG HYDROXIDE-SIMETH 200-200-20 MG/5ML PO SUSP
30.0000 mL | ORAL | Status: DC | PRN
  Administered 2013-08-26: 30 mL via ORAL

## 2013-08-21 NOTE — Progress Notes (Signed)
Patient ID: Jared [name], male   DOB: 01/04/1961, 53 y.o.   MRN: [id number]  Patient is a [age]yr old voluntary admission [facility] Went to ED C/O chronic pain and using heroin to help with pain. Reports increased depression with suicidal ideations. Reports had thoughts of shooting or hanging self. Presently denies any having a gun at home. Reports a hx of ETOH use but has not had issues with ETOH in years. 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 Reports that he was going to pain clinics at one point for pain but not using pain pills or patches recently just heroin. States the heroin cuts out his pain all day. Reports his use varies each day. + opiates and Benzos. Will start on clonidine protocol. Denies any recent medical issues except pain issues. Cooperative on unit.

## 2013-08-21 NOTE — Consult Note (Addendum)
  Psychiatric Specialty Exam: Physical Exam  ROS  Blood pressure 140/93, pulse 75, temperature 98.6 F (37 C), temperature source Oral, resp. rate 18, SpO2 98.00%.There is no weight on file to calculate BMI.  General Appearance: Disheveled  Eye Contact::  Poor  Speech:  Clear and Coherent  Volume:  Decreased  Mood:  Irritable  Affect:  Congruent  Thought Process:  Logical  Orientation:  Full (Time, Place, and Person)  Thought Content:  Negative  Suicidal Thoughts:  Yes.  with intent/plan  Homicidal Thoughts:  No  Memory:  Immediate;   Good Recent;   Good Remote;   Good  Judgement:  Intact  Insight:  Shallow  Psychomotor Activity:  Normal  Concentration:  Good  Recall:  Good  Akathisia:  Negative  Handed:  Right  AIMS (if indicated):     Assets:  Communication Skills  Sleep:   adequate  [name] insists he is suicidal.  His main focus is his chronic pain and not getting enough medication to manage it.  He sees [facility] and says they have been moving too slowly for him  Plan is to continue looking for an inpatient bed to treat his depression and suicidal ideation.  He wants trazodone for sleep and gabapentin for nerves and pain.  Addendum: [name]
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
## 2013-08-21 NOTE — ED Notes (Signed)
[facility] called for transport, informed pt of plan of care.

## 2013-08-21 NOTE — Progress Notes (Signed)
Patient did attend the evening karaoke group.  

## 2013-08-21 NOTE — Tx Team (Signed)
Initial Interdisciplinary Treatment Plan  PATIENT STRENGTHS: (choose at least two) Ability for insight Active sense of humor Average or above average intelligence Capable of independent living Communication skills Motivation for treatment/growth Religious Affiliation Supportive family/friends  PATIENT STRESSORS: Financial difficulties Health problems Legal issue Substance abuse   PROBLEM LIST: Problem List/Patient Goals Date to be addressed Date deferred Reason deferred Estimated date of resolution  Opiate abuse 08/21/13     Depression 08/21/13     Chronic Pain 08/21/13                                          DISCHARGE CRITERIA:  Ability to meet basic life and health needs Adequate post-discharge living arrangements Improved stabilization in mood, thinking, and/or behavior Withdrawal symptoms are absent or subacute and managed without 24-hour nursing intervention  PRELIMINARY DISCHARGE PLAN: Attend 12-step recovery group Outpatient therapy Return to previous living arrangement  PATIENT/FAMIILY INVOLVEMENT: This treatment plan has been presented to and reviewed with the patient, Jared [name], and/or family member, .  The patient and family have been given the opportunity to ask questions and make suggestions.  Manuela Schwartzritchett, Kamilo Och [facility] 08/21/2013, 6:11 PM

## 2013-08-21 NOTE — Consult Note (Signed)
Face to face evaluation and I agree with this evaluation 

## 2013-08-22 ENCOUNTER — Encounter (HOSPITAL_COMMUNITY): Payer: Self-pay | Admitting: Psychiatry

## 2013-08-22 DIAGNOSIS — F1120 Opioid dependence, uncomplicated: Secondary | ICD-10-CM | POA: Diagnosis present

## 2013-08-22 DIAGNOSIS — F1994 Other psychoactive substance use, unspecified with psychoactive substance-induced mood disorder: Secondary | ICD-10-CM | POA: Diagnosis present

## 2013-08-22 MED ORDER — BUPRENORPHINE HCL 2 MG SL SUBL
4.0000 mg | SUBLINGUAL_TABLET | Freq: Two times a day (BID) | SUBLINGUAL | Status: DC
  Administered 2013-08-22 – 2013-08-25 (×7): 4 mg via SUBLINGUAL
  Filled 2013-08-22 (×7): qty 2

## 2013-08-22 NOTE — [facility] Suicide Risk Assessment (Signed)
Suicide Risk Assessment  Admission Assessment     Nursing information obtained from:  Patient;Review of record Demographic factors:  Male;Divorced or widowed;Caucasian;Living alone;Unemployed Current Mental Status:  Suicidal ideation indicated by patient;Self-harm thoughts Loss Factors:  Decline in physical health;Legal issues;Financial problems / change in socioeconomic status Historical Factors:  Impulsivity Risk Reduction Factors:  Positive social support;Positive therapeutic relationship Total Time spent with patient: 45 minutes  CLINICAL FACTORS:   Depression:   Comorbid alcohol abuse/dependence Alcohol/Substance Abuse/Dependencies  PCOGNITIVE FEATURES THAT CONTRIBUTE TO RISK:  Closed-mindedness Polarized thinking Thought constriction (tunnel vision)    SUICIDE RISK:   Moderate:  Frequent suicidal ideation with limited intensity, and duration, some specificity in terms of plans, no associated intent, good self-control, limited dysphoria/symptomatology, some risk factors present, and identifiable protective factors, including available and accessible social support.  PLAN OF CARE: Supportive approach/copign skills/relapse prevention                               Use Subutex for detox                               Address the co morbidities  I certify that inpatient services furnished can reasonably be expected to improve the patient's condition.  Janea Schwenn A 08/22/2013, 12:59 PM

## 2013-08-22 NOTE — Progress Notes (Signed)
D:  Pt + SI- contracts, but stated it's getting better.Pt denies HI/VH. Pt is pleasant and cooperative.    A: Pt was offered support and encouragement. Pt was given scheduled medications. Pt was encourage to attend groups. Q 15 minute checks were done for safety.   R:Pt attends groups and interacts well with peers and staff. Pt is taking medication. Pt has no complaints at this time.Pt receptive to treatment and safety maintained on unit.
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
## 2013-08-22 NOTE — Progress Notes (Signed)
D: Patient denies SI/HI and A/V hallucinations; patient reports sleep to be; reports that he requested medication appetite is improving; reports energy level is low ; reports ability to pay attention is improving; rates depression as 7/10; rates hopelessness 7/10; reports anxiety  A: Monitored q 15 minutes; patient encouraged to attend groups; patient educated about medications; patient given medications per physician orders; patient encouraged to express feelings and/or concerns  R: Patient is flat and blunted; patient is cooperative;  patient's interaction with staff and peers is appropriate ; patient was able to set goal to talk with staff 1:1 when having feelings of SI; patient is taking medications as prescribed and tolerating medications; patient is attending all groups

## 2013-08-22 NOTE — Tx Team (Signed)
Interdisciplinary Treatment Plan Update (Adult)  Date: 08/22/2013  Time Reviewed:  9:45 AM  Progress in Treatment: Attending groups: Yes Participating in groups:  Yes Taking medication as prescribed:  Yes Tolerating medication:  Yes Family/Significant othe contact made: CSW assessing Patient understands diagnosis:  Yes Discussing patient identified problems/goals with staff:  Yes Medical problems stabilized or resolved:  Yes Denies suicidal/homicidal ideation: Yes Issues/concerns per patient self-inventory:  Yes Other:  New problem(s) identified: N/A  Discharge Plan or Barriers: CSW assessing for appropriate referrals.    Reason for Continuation of Hospitalization: Anxiety Depression Detox Medication Stabilization  Comments: N/A  Estimated length of stay: 3-5 days   For review of initial/current patient goals, please see plan of care.  Attendees: Patient:     Family:     Physician:  Dr. Dub MikesLugo 08/22/2013 10:04 AM   Nursing:   Roswell Minersonna Shimp, RN 08/22/2013 10:04 AM   Clinical Social Worker:  Reyes Ivanhelsea Horton, LCSW 08/22/2013 10:04 AM   Other: Onnie BoerJennifer Clark, RN case manager 08/22/2013 10:04 AM   Other:  Trula SladeHeather Smart, LCSWA 08/22/2013 10:04 AM   Other:  Leighton ParodyBritney Tyson, RN 08/22/2013 10:05 AM   Other:  Lowella Griponeicia Byrd, RN 08/22/2013 10:05 AM   Other: Serena ColonelAggie Nwoko, NP 08/22/2013 10:05 AM   Other: Tomasita Morrowelora Sutton, care coordination 08/22/2013 10:05 AM   Other:    Other:    Other:    Other:     Scribe for Treatment Team:   Carmina MillerHorton, Kiley Solimine Nicole, 08/22/2013 , 10:04 AM

## 2013-08-22 NOTE — [facility] Counselor (Signed)
Adult Psychosocial Assessment Update Interdisciplinary Team  Previous [facility] admissions/discharges:  Admissions Discharges  Date: 10/28/12 Date: 11/01/12  Date: Date:  Date: Date:  Date: Date:  Date: Date:   Changes since the last Psychosocial Assessment (including adherence to outpatient mental health and/or substance abuse treatment, situational issues contributing to decompensation and/or relapse). Pt reports coming to the hospital for depressive symptoms and suicidal ideation.  Pt states that he's been using heroin IV for the last 3 months but have been using opiates since 2009.  Pt states that he goes to [facility] for medication management and therapy regularly.               Discharge Plan 1. Will you be returning to the same living situation after discharge?   Yes:  X [place]   No:      If no, what is your plan?           2. Would you like a referral for services when you are discharged? Yes:  X   If yes, for what services? Pt states that he goes to [facility] for medication management and therapy.   No:              Summary and Recommendations (to be completed by the evaluator) Patient is a 53 year old Caucasian Male with a diagnosis of Depressive Disorder NOS and Substance Abuse.  Patient lives in [place] alone.  Patient will benefit from crisis stabilization, medication evaluation, group therapy and psycho education in addition to case management for discharge planning.                         Signature:  Carmina MillerHorton, Amer Alcindor Nicole, 08/22/2013 9:16 AM

## 2013-08-22 NOTE — Progress Notes (Signed)
Adult Psychoeducational Group Note  Date:  08/22/2013 Time:  6:53 PM  Group Topic/Focus:  Early Warning Signs:   The focus of this group is to help patients identify signs or symptoms they exhibit before slipping into an unhealthy state or crisis.  Participation Level:  Active  Participation Quality:  Appropriate, Attentive and Sharing  Affect:  Appropriate  Cognitive:  Alert  Insight: Good  Engagement in Group:  Engaged  Modes of Intervention:  Activity, Discussion, Education, Exploration, Socialization and Support  Additional Comments:  Pt came to group and was very sharing and supportive of the other group members. Pt shared that feeling overwhelmed and lack of willpower and depression are his early warning signs for relapse. Pt plans on turning to his spirituality and activities at church for relapse prevention coping skills.     Cathlean CowerClouse, Bevelyn Arriola Y 08/22/2013, 6:53 PM

## 2013-08-22 NOTE — [facility] Group Notes (Signed)
[facility] LCSW Group Therapy  08/22/2013  1:15 PM   Type of Therapy:  Group Therapy  Participation Level:  Active  Participation Quality:  Attentive, Sharing and Supportive  Affect:  Depressed and Flat  Cognitive:  Alert and Oriented  Insight:  Developing/Improving and Engaged  Engagement in Therapy:  Developing/Improving and Engaged  Modes of Intervention:  Clarification, Confrontation, Discussion, Education, Exploration, Limit-setting, Orientation, Problem-solving, Rapport Building, Dance movement psychotherapisteality Testing, Socialization and Support  Summary of Progress/Problems: The topic for today was feelings about relapse.  Pt discussed what relapse prevention is to them and identified triggers that they are on the path to relapse.  Pt processed their feeling towards relapse and was able to relate to peers.  Pt discussed coping skills that can be used for relapse prevention.  Pt shared that he continues to use opiates due to pain, which leads to suicidal thoughts.  Pt states that he also deals with withdrawal symptoms at home when he runs out of drugs.  Pt states that he is tired of using and hopes his higher power will take the desire to use from him.  Pt has to be redirected at times due to monopolizing.  Pt actively participated and was engaged in group discussion.    Jared IvanChelsea Horton, LCSW 08/22/2013  2:13 PM

## 2013-08-22 NOTE — [facility] Suicide Risk Assessment (Signed)
[facility] Family/Significant Other Suicide Prevention Education  Suicide Prevention Education:   Patient Refusal for Family/Significant Other Suicide Prevention Education: The patient has refused to provide written consent for family/significant other to be provided Family/Significant Other Suicide Prevention Education during admission and/or prior to discharge.  Physician notified.  CSW provided suicide prevention information with patient.    The suicide prevention education provided includes the following:  Suicide risk factors  Suicide prevention and interventions  National Suicide Hotline telephone number  [facility] assessment telephone number  [facility] Emergency Assistance 911  [facility] and/or [facility]idential Mobile Crisis Unit telephone number   Reyes IvanChelsea Horton, [place] 08/22/2013 9:38 AM
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
## 2013-08-23 DIAGNOSIS — R45851 Suicidal ideations: Secondary | ICD-10-CM

## 2013-08-23 NOTE — Progress Notes (Signed)
[facility] Group Notes:  (Nursing/MHT/Case Management/Adjunct)  Date:  08/23/2013  Time:  6:22 PM  Type of Therapy:  Psychoeducational Skills  Participation Level:  Active  Participation Quality:  Appropriate, Attentive, Sharing and Supportive  Affect:  Appropriate  Cognitive:  Appropriate  Insight:  Appropriate  Engagement in Group:  Engaged  Modes of Intervention:  Activity and Support  Summary of Progress/Problems: Pts played a game of Human Bingo and learned to socialize in a different setting and learned about their peers.  Gearldene Fiorenza C 08/23/2013, 6:22 PM 

## 2013-08-23 NOTE — ED Provider Notes (Signed)
Medical screening examination/treatment/procedure(s) were performed by non-physician practitioner and as supervising physician I was immediately available for consultation/collaboration.   EKG Interpretation None        Taiana Temkin, MD 08/23/13 1015 

## 2013-08-23 NOTE — [facility] Group Notes (Signed)
[facility] Group Notes:  (Nursing/MHT/Case Management/Adjunct)  Date:  08/23/2013  Time:  11:53 AM  Type of Therapy:  Psychoeducational Skills  Participation Level:  Active  Participation Quality:  Appropriate  Affect:  Appropriate  Cognitive:  Appropriate  Insight:  Appropriate  Engagement in Group:  Engaged  Modes of Intervention:  Discussion  Summary of Progress/Problems: Pt did attend self inventory group, pt reported that he was negative SI/HI, no AH/VH noted. Pt rated his depression as a 5, and his helplessness/hopelessness as a 0.   Pt also reported that he is not ready for discharge and that he needs more time for his body to heal.   Buford DresserForrest, Avarose Mervine Shanta 08/23/2013, 11:53 AM

## 2013-08-23 NOTE — Progress Notes (Addendum)
08-23-13 NSG NOTE  7a-3p  D: Affect is depressed.  Mood is depressed.  Behavior is cooperative with encouragement, direction and support.  Interacts appropriately with peers and staff.  Participated in self inventory group, addiction group, and counselor lead group.  Goal for today is to work on identifying healthy coping skills.   Also stated what changes were needed to be made post discharge to be successful.  Reports fair sleep and improving appetite.  States ability to pay attention is improving and energy level is low.  A:  Medications per MD order.  Support given throughout day.  1:1 time spent with pt.  R:  Following treatment plan.  Passive SI and auditory hallucinations. Denies HI, or visual hallucinations.  Contracts for safety.

## 2013-08-23 NOTE — Progress Notes (Signed)
[facility] Group Notes:  (Nursing/MHT/Case Management/Adjunct)  Date:  08/23/2013  Time:  2100  Type of Therapy:  wrap up group  Participation Level:  Active  Participation Quality:  Appropriate, Attentive, Sharing and Supportive  Affect:  Appropriate and Excited  Cognitive:  Alert  Insight:  Lacking  Engagement in Group:  Engaged  Modes of Intervention:  Clarification, Education and Support  Summary of Progress/Problems:  Shelah LewandowskySquires, Eon Zunker Carol 08/23/2013, 10:32 PM
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
## 2013-08-23 NOTE — [facility] Group Notes (Signed)
[facility] Group Notes:  (Nursing/MHT/Case Management/Adjunct)  Date:  08/23/2013  Time:  1:45 PM  Type of Therapy:  Psychoeducational Skills  Participation Level:  Active  Participation Quality:  Appropriate  Affect:  Appropriate  Cognitive:  Appropriate  Insight:  Appropriate  Engagement in Group:  Engaged  Modes of Intervention:  Clarification, Discussion, Education and Support  Summary of Progress/Problems:Video and discussion on how attitude effects addiction.  Also group discussion on healthy coping skills.   Inda MerlinWilliams, Mayerli Kirst R 08/23/2013, 1:45 PM
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
## 2013-08-24 NOTE — [facility] Group Notes (Signed)
[facility] Group Notes:  (Clinical Social Work)  08/24/2013  10:00-11:00AM  Summary of Progress/Problems:   The main focus of today's process group was to   identify the patient's current support system and decide on other supports that can be put in place.  The picture on workbook was used to discuss why additional supports are needed.  An emphasis was placed on using counselor, doctor, therapy groups, 12-step groups, and problem-specific support groups to expand supports.   There was also an extensive discussion about what constitutes a healthy support versus an unhealthy support.  The patient expressed full comprehension of the concepts presented, and agreed that there is a need to add more supports.  He stated that he has always had a difficult time with money, and that one of the best times in his life was when he made money but handed it over to his then-wife to manage.  He is currently awaiting approval of disability, and said he will strongly consider having his money managed by someone else so that he does not have the struggle with having money accessible to buy drugs.  He talked at length about how he needs to humble himself instead of letting his pride keep him using and then cause his death.  Type of Therapy:  Process Group with Motivational Interviewing  Participation Level:  Active  Participation Quality:  Attentive, Sharing and Supportive  Affect:  Blunted  Cognitive:  Appropriate and Oriented  Insight:  Engaged  Engagement in Therapy:  Engaged  Modes of Intervention:   Education, Support and Processing, Activity  Pilgrim's PrideMareida Grossman-Orr, LCSW 08/24/2013, 12:15pm

## 2013-08-24 NOTE — Progress Notes (Signed)
D: pt seen sitting in dayroom engaging with other pt, reading the bible. Pt stated he has had a better day today than he did yesterday. Pt stated he is still feeling a little anxious and agitated. Pt stated he knows he is not ready to leave yet. He feels as though he is not where he needs to be. denies si/hi/avh. Pt c/o of back and leg pain rating it 6/10 A: 1:1 time given. q 15 min safety checks. scheduled medications given R: pt remains safe on unit. No further signs of distress or complaints at this time

## 2013-08-24 NOTE — [facility] Group Notes (Signed)
Psychoeducational Group Note  Date:  08/24/2013 Time:  0900  Group Topic/Focus:  Orientation: The focus of this group is to educate the patient on the purpose and policies of crisis stabilization and provide a format to answer questions about their admission.  The group details unit policies and expectations of patients while admitted.  Participation Level:appropriate  Participation Quality: appropriate  Affect: appropriate  Cognitive: appropriate  Insight: appropriate  Engagement in Group: Engaged  Additional Comments:  Jule SerKent, Nicholaos Schippers Gail 08/24/2013, 10:57 AM

## 2013-08-24 NOTE — Progress Notes (Signed)
D: pt stated he is feeling frustrated and agitated this evening, pt stated he doesn't really know why he is feeling this way. " must be the weather." denies si/hi/avh. Denies pain. Pt no c/o any other withdrawal symptoms besides anxiety.  A: 1:1 time given. q 15 min safety checks. scheduled medications given R: pt remains safe on unit. No further signs of distress or complaints at this time

## 2013-08-24 NOTE — Progress Notes (Signed)
Patient ID: Jared [name], male   DOB: 11/03/1960, 53 y.o.   MR[facility]: [id number] [facility]  08/24/2013 3:19 PM Jared [name]  MRN:  [id number] Subjective:   Patient states "I'm going through a bad cycle. I'm trying go stop using narcotics but then my pain flares up. I'm about to lose everything and will end up on the streets. I feel depressed. I've been trying to get on Disability for a while. I just have to get it together this time."  Objective:  Patient is visible on the unit and participating in groups. Today the patient is fixated on his chronic pain and financial problems. The patient requires constant redirection to talk about his psychiatric issues that are relevant for today. Endorses passive SI with no plan. Rates depression and hopelessness at six. Last CIWA score is three. Patient appears very depressed and ruminates over his stressors. He has poor understanding of his treatment. Collan is taking subutex twice daily but asks writer is this treatment can be ordered for him. The patient continues to appear un-kept with very poor hygiene. [name] is very receptive to support and encouragement that is offered to him.   Diagnosis:   DSM5: Total Time spent with patient: 20 minutes Schizophrenia Disorders: none  Obsessive-Compulsive Disorders: none  Trauma-Stressor Disorders: none  Substance/Addictive Disorders: Opioid Disorder - Severe (304.00)  Depressive Disorders: Substance Induced  AXIS I: Substance Induced Mood Disorder, GAD  AXIS II: Deferred  AXIS III:  Past Medical History   Diagnosis  Date   .  Chronic pain    .  Polysubstance abuse    .  Back pain    .  Depression    .  Alcoholism    .  Anxiety    .  Migraine    .  Arthritis      DDD   .  Drug abuse  08/13/2012    AXIS IV: other psychosocial or environmental problems  AXIS V: 41-50 serious symptoms  ADL's:  Intact  Sleep: Fair  Appetite:  Good  Suicidal Ideation:  Passive SI no plan Homicidal Ideation:  Denies AEB  (as evidenced by):  Psychiatric Specialty Exam: Physical Exam  Review of Systems  Constitutional: Negative.   HENT: Negative.   Eyes: Negative.   Respiratory: Negative.   Cardiovascular: Negative.   Gastrointestinal: Negative.   Genitourinary: Negative.   Musculoskeletal: Positive for back pain, joint pain and myalgias.  Skin: Negative.   Neurological: Negative.   Endo/Heme/Allergies: Negative.   Psychiatric/Behavioral: Positive for depression, suicidal ideas and substance abuse. Negative for hallucinations and memory loss. The patient is nervous/anxious. The patient does not have insomnia.     Blood pressure 111/71, pulse 66, temperature 97.8 F (36.6 C), temperature source Oral, resp. rate 20, height 5' 7.72" (1.72 m), weight 69.854 kg (154 lb).Body mass index is 23.61 kg/(m^2).  General Appearance: Disheveled  Eye Contact::  Good  Speech:  Clear and Coherent and Slow  Volume:  Normal  Mood:  Anxious and Depressed  Affect:  anxious   Thought Process:  Coherent and Goal Directed  Orientation:  Full (Time, Place, and Person)  Thought Content:  worries, concerns  Suicidal Thoughts:  Yes.  without intent/plan  Homicidal Thoughts:  No  Memory:  Immediate;   Fair Recent;   Fair Remote;   Fair  Judgement:  Fair  Insight:  Shallow  Psychomotor Activity:  Restlessness  Concentration:  Fair  Recall:  FiservFair  Fund of Knowledge:Fair  Language: Fair  Akathisia:  No  Handed:  Right  AIMS (if indicated):     Assets:  Communication Skills Desire for Improvement Resilience Social Support  Sleep:  Number of Hours: 4.5   Musculoskeletal: Strength & Muscle Tone: within normal limits Gait & Station: normal Patient leans: N/A  Current Medications: Current Facility-Administered Medications  Medication Dose Route Frequency Provider Last Rate Last Dose  . alum & mag hydroxide-simeth (MAALOX/MYLANTA) 200-200-20 MG/5ML suspension 30 mL  30 mL Oral Q4H PRN Benjaman Pott, MD      .  buprenorphine (SUBUTEX) SL tablet 4 mg  4 mg Sublingual BID Rachael Fee, MD   4 mg at 08/24/13 [phone]  . dicyclomine (BENTYL) tablet 20 mg  20 mg Oral Q6H PRN Beau Fanny, FNP      . gabapentin (NEURONTIN) capsule 300 mg  300 mg Oral TID Benjaman Pott, MD   300 mg at 08/24/13 1144  . hydrOXYzine (ATARAX/VISTARIL) tablet 25 mg  25 mg Oral Q6H PRN Beau Fanny, FNP   25 mg at 08/24/13 1191  . loperamide (IMODIUM) capsule 2-4 mg  2-4 mg Oral PRN Beau Fanny, FNP      . magnesium hydroxide (MILK OF MAGNESIA) suspension 30 mL  30 mL Oral Daily PRN Benjaman Pott, MD      . methocarbamol (ROBAXIN) tablet 500 mg  500 mg Oral Q8H PRN Beau Fanny, FNP   500 mg at 08/23/13 1713  . naproxen (NAPROSYN) tablet 500 mg  500 mg Oral BID PRN Beau Fanny, FNP   500 mg at 08/24/13 4782  . nicotine (NICODERM CQ - dosed in mg/24 hours) patch 21 mg  21 mg Transdermal Daily Benjaman Pott, MD   21 mg at 08/24/13 0800  . ondansetron (ZOFRAN-ODT) disintegrating tablet 4 mg  4 mg Oral Q6H PRN Beau Fanny, FNP      . traZODone (DESYREL) tablet 100 mg  100 mg Oral QHS Benjaman Pott, MD   100 mg at 08/23/13 2244    Lab Results: No results found for this or any previous visit (from the past 48 hour(s)).  Physical Findings: AIMS: Facial and Oral Movements Muscles of Facial Expression: None, normal Lips and Perioral Area: None, normal Jaw: None, normal Tongue: None, normal,Extremity Movements Upper (arms, wrists, hands, fingers): None, normal Lower (legs, knees, ankles, toes): None, normal, Trunk Movements Neck, shoulders, hips: None, normal, Overall Severity Severity of abnormal movements (highest score from questions above): None, normal Incapacitation due to abnormal movements: None, normal Patient's awareness of abnormal movements (rate only patient's report): No Awareness, Dental Status Current problems with teeth and/or dentures?: No Does patient usually wear dentures?: No  CIWA:  CIWA-Ar  Total: 3 COWS:  COWS Total Score: 6  Treatment Plan Summary: Daily contact with patient to assess and evaluate symptoms and progress in treatment Medication management  Plan: Continue crisis management and stabilization.  Medication management: Continue Subutex 4 mg twice daily for opioid dependence, Neurontin 300 mg TID for improved mood stability/pain, Trazodone 100 mg hs insomnia.  Encouraged patient to attend groups and participate in group counseling sessions and activities.  Discharge plan in progress.  Continue current treatment plan.  Address health issues: Vitals reviewed and stable.  Medical Decision Making Problem Points:  Established problem, stable/improving (2), Review of last therapy session (1) and Review of psycho-social stressors (1) Data Points:  Review of medication regiment & side effects (2) I certify that inpatient  services furnished can reasonably be expected to improve the patient's condition.  Fransisca Kaufmann NP-C 08/24/2013, 3:19 PM   Patient seen, evaluated and I agree with notes by Nurse Practitioner. Thedore Mins, MD

## 2013-08-24 NOTE — Progress Notes (Addendum)
Patient stated he is not ready for discharge, which he will discuss with MD tomorrow.   Stated he does not have SI and HI at this time, contracts for safety.  Denied A/V hallucinations.  Went to dining room for dinner with group. 1830  Patient had visitors tonight.  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  Pt feels that God is testing him so he can witness to others.

## 2013-08-24 NOTE — Progress Notes (Signed)
[facility] Group Notes:  (Nursing/MHT/Case Management/Adjunct)  Date:  08/24/2013  Time:  6:21 PM  Type of Therapy:  Psychoeducational Skills  Participation Level: Did not attend  Modes of Intervention:  Activity  Summary of Progress/Problems: Pt came in as group was wrapping up. Pts played a game of Pictionary using coping skills. Pt stated thinking positive thoughts is a good coping skill and listening to other people helps him refocus  Jared [name], [name] C 08/24/2013, 6:21 PM

## 2013-08-24 NOTE — Progress Notes (Signed)
Patient ID: Jared [name], male   DOB: 07/04/1960, 53 y.o.   MRN: [id number] D: Pt is awake and active on the unit this AM. Pt reports passive Si but is able to contract for safety. Pt rates their depression at 6 and hopelessness at 6. Pt's most recent CIWA score was 3. Pt mood is appropriate and his affect is bright. Pt writes that he is "having depression over just living and trying to figure out how I'm going to keep making it." Pt reports being in chronic pain in his back right leg and foot/arch, and that he has been trying to get disability for 6 years for this reason.   A: Encouraged pt to discuss feelings with staff and administered medication per MD orders. Writer also encouraged pt to participate in groups.  R: Writer will continue to monitor. 15 minute checks are ongoing for safety.

## 2013-08-24 NOTE — Progress Notes (Signed)
Patient did attend the evening speaker AA meeting.  

## 2013-08-25 MED ORDER — GABAPENTIN 400 MG PO CAPS
400.0000 mg | ORAL_CAPSULE | Freq: Three times a day (TID) | ORAL | Status: DC
  Administered 2013-08-25 – 2013-08-29 (×11): 400 mg via ORAL
  Administered 2013-08-29: 18:00:00 via ORAL
  Administered 2013-08-29 – 2013-08-30 (×3): 400 mg via ORAL
  Filled 2013-08-25 (×19): qty 1

## 2013-08-25 MED ORDER — GABAPENTIN 300 MG PO CAPS: 300.0000 mg | ORAL_CAPSULE | Freq: Every day | ORAL | Status: DC

## 2013-08-25 MED ORDER — INFLUENZA VAC SPLIT QUAD 0.5 ML IM SUSP
0.5000 mL | INTRAMUSCULAR | Status: AC
  Administered 2013-08-26: 0.5 mL via INTRAMUSCULAR
  Filled 2013-08-25: qty 0.5

## 2013-08-25 MED ORDER — DULOXETINE HCL 30 MG PO CPEP
30.0000 mg | ORAL_CAPSULE | Freq: Every day | ORAL | Status: DC
  Administered 2013-08-25 – 2013-08-26 (×2): 30 mg via ORAL
  Filled 2013-08-25 (×4): qty 1

## 2013-08-25 MED ORDER — BUPRENORPHINE HCL 2 MG SL SUBL
2.0000 mg | SUBLINGUAL_TABLET | Freq: Two times a day (BID) | SUBLINGUAL | Status: DC
  Administered 2013-08-25 – 2013-08-30 (×10): 2 mg via SUBLINGUAL
  Filled 2013-08-25 (×10): qty 1

## 2013-08-25 MED ORDER — GABAPENTIN 400 MG PO CAPS
400.0000 mg | ORAL_CAPSULE | Freq: Every day | ORAL | Status: DC
  Administered 2013-08-25 – 2013-08-29 (×5): 400 mg via ORAL
  Filled 2013-08-25 (×7): qty 1

## 2013-08-25 NOTE — Progress Notes (Signed)
D:  Patient up and active in the milieu most of the day.  Attending and participating in groups.  Has had some anxiety today which has responded to medication and redirection.  He rates depression and hopelessness both at 4 and denies suicidal ideation.   A:  Medications given as prescribed.  Encouraged participation in all groups.  Reminded patient to use alternate coping methods when anxiety increases rather than asking for a pill every time.   R:  Patient receptive to education.  Cooperative with staff.  Interacting well with peers.  Interacting well in group settings.  Tolerating medications well.  Safety is maintained on the unit.

## 2013-08-25 NOTE — [facility] Group Notes (Signed)
[facility]   08/25/2013 8:45 AM  Participation Quality:  Alert, Appropriate and Oriented  Mood/Affect:  Flat and Depressed  Depression Rating:  4  Anxiety Rating:  6  Thoughts of Suicide:  Pt denies SI/HI  Will you contract for safety?   Yes  Current AVH:  Pt denies  Plan for Discharge/Comments:  Pt attended discharge planning group and actively participated in group.  CSW provided pt with today's workbook.  Pt reports not feeling well today.  [place]  [facility] medication management and therapy.  No further needs voiced by pt at this time.    Transportation Means: Pt reports access to transportation - preacher will pick pt up  Supports: No supports mentioned at this time  Reyes IvanChelsea Horton, LCSW 08/25/2013 9:36 AM

## 2013-08-25 NOTE — [facility] Group Notes (Signed)
[facility] LCSW Group Therapy  08/25/2013   1:15 PM   Type of Therapy:  Group Therapy  Participation Level:  Active  Participation Quality:  Attentive, Sharing and Supportive  Affect:  Depressed and Flat  Cognitive:  Alert and Oriented  Insight:  Developing/Improving and Engaged  Engagement in Therapy:  Developing/Improving and Engaged  Modes of Intervention:  Clarification, Confrontation, Discussion, Education, Exploration, Limit-setting, Orientation, Problem-solving, Rapport Building, Dance movement psychotherapisteality Testing, Socialization and Support  Summary of Progress/Problems: Pt identified obstacles faced currently and processed barriers involved in overcoming these obstacles. Pt identified steps necessary for overcoming these obstacles and explored motivation (internal and external) for facing these difficulties head on. Pt further identified one area of concern in their lives and chose a goal to focus on for today.  Pt shared that his obstacle is his pain, as it is something chronic he will always have to deal with.  Pt was able to process knowing that using narcotics will cause him to eventually lose everything.  Pt discussed consequences if he continues to use, which was insightful of pt.  Pt states that he plans to use his support system and faith to overcome his addiction.  Pt can be intrusive, interrupting peers and monopolizing.  Pt actively participated and was engaged in group discussion.    Jared IvanChelsea Horton, LCSW 08/25/2013  2:49 PM

## 2013-08-25 NOTE — Progress Notes (Signed)
Adult Psychoeducational Group Note  Date:  08/25/2013 Time:  10:00 am  Group Topic/Focus:  Wellness Toolbox:   The focus of this group is to discuss various aspects of wellness, balancing those aspects and exploring ways to increase the ability to experience wellness.  Patients will create a wellness toolbox for use upon discharge.  Participation Level:  Active  Participation Quality:  Appropriate, Sharing and Supportive  Affect:  Appropriate  Cognitive:  Appropriate  Insight: Appropriate  Engagement in Group:  Engaged  Modes of Intervention:  Discussion, Education, Socialization and Support  Additional Comments:  Pt stated that by eating better and going back to school to obtain his G.E.D.  That he can promote health and wellness in his life. Pt stated chronic pain and narcotics use have been barriers to his progress made.   Kirstyn Lean 08/25/2013, 11:07 AM

## 2013-08-25 NOTE — Progress Notes (Signed)
[facility]  08/25/2013 7:29 PM Eulogio Janyth ContesD Nhan  MRN:  [id number] Subjective:  Jared [name] is worried about long term management of his pain. He has problems with his neck his back his joints. He does not want to be on opioids but would like to have some kind of help for it. We are going to wean off the Subutex. Stated he would like to find an MD who would see him trough medicaid. He states he feels some pain relief with Suboxone in the past. Willing to try what ever we can offer as he is tired of being in pain Diagnosis:   DSM5: Schizophrenia Disorders:  none Obsessive-Compulsive Disorders:  none Trauma-Stressor Disorders:  none Substance/Addictive Disorders:  Opioid Disorder - Severe (304.00) Depressive Disorders:  Major Depressive Disorder - Moderate (296.22) Total Time spent with patient: 30 minutes  Axis I: Generalized Anxiety Disorder and Substance Induced Mood Disorder  ADL's:  Intact  Sleep: Fair  Appetite:  Fair  Suicidal Ideation:  Plan:  denies Intent:  denies Means:  denies Homicidal Ideation:  Plan:  denies Intent:  denies Means:  denies AEB (as evidenced by):  Psychiatric Specialty Exam: Physical Exam  Review of Systems  Constitutional: Positive for malaise/fatigue.  Eyes: Negative.   Respiratory: Negative.   Cardiovascular: Negative.   Gastrointestinal: Negative.   Genitourinary: Negative.   Musculoskeletal: Positive for back pain, joint pain and neck pain.  Skin: Negative.   Neurological: Negative.   Endo/Heme/Allergies: Negative.   Psychiatric/Behavioral: Positive for depression and substance abuse. The patient is nervous/anxious.     Blood pressure 124/77, pulse 56, temperature 97.8 F (36.6 C), temperature source Oral, resp. rate 16, height 5' 7.72" (1.72 m), weight 69.854 kg (154 lb).Body mass index is 23.61 kg/(m^2).  General Appearance: Fairly Groomed  Patent attorneyye Contact::  Fair  Speech:  Clear and Coherent  Volume:  fluctuates  Mood:  Anxious and  worried  Affect:  Anxious, worried, in pain  Thought Process:  Coherent and Goal Directed  Orientation:  Full (Time, Place, and Person)  Thought Content:  symtpoms, worries, concerns  Suicidal Thoughts:  No  Homicidal Thoughts:  No  Memory:  Immediate;   Fair Recent;   Fair Remote;   Fair  Judgement:  Fair  Insight:  Present  Psychomotor Activity:  Restlessness  Concentration:  Fair  Recall:  [employer]Fair  Fund of Knowledge:NA  Language: Fair  Akathisia:  No  Handed:    AIMS (if indicated):     Assets:  Desire for Improvement  Sleep:  Number of Hours: 5.75   Musculoskeletal: Strength & Muscle Tone: within normal limits Gait & Station: normal Patient leans: N/A  Current Medications: Current Facility-Administered Medications  Medication Dose Route Frequency Provider Last Rate Last Dose  . alum & mag hydroxide-simeth (MAALOX/MYLANTA) 200-200-20 MG/5ML suspension 30 mL  30 mL Oral Q4H PRN Benjaman PottGerald D Taylor, MD      . buprenorphine (SUBUTEX) SL tablet 2 mg  2 mg Sublingual BID Rachael FeeIrving A Sahalie Beth, MD      . dicyclomine (BENTYL) tablet 20 mg  20 mg Oral Q6H PRN Beau FannyJohn C Withrow, FNP      . DULoxetine (CYMBALTA) DR capsule 30 mg  30 mg Oral Daily Rachael FeeIrving A Yassen Kinnett, MD   30 mg at 08/25/13 1544  . gabapentin (NEURONTIN) capsule 400 mg  400 mg Oral TID Rachael FeeIrving A Raynee Mccasland, MD   400 mg at 08/25/13 1704  . gabapentin (NEURONTIN) capsule 400 mg  400 mg Oral QHS  Rachael Fee, MD      . hydrOXYzine (ATARAX/VISTARIL) tablet 25 mg  25 mg Oral Q6H PRN Beau Fanny, FNP   25 mg at 08/25/13 1705  . [START ON 08/26/2013] influenza vac split quadrivalent PF (FLUARIX) injection 0.5 mL  0.5 mL Intramuscular Tomorrow-1000 Rachael Fee, MD      . loperamide (IMODIUM) capsule 2-4 mg  2-4 mg Oral PRN Beau Fanny, FNP      . magnesium hydroxide (MILK OF MAGNESIA) suspension 30 mL  30 mL Oral Daily PRN Benjaman Pott, MD      . methocarbamol (ROBAXIN) tablet 500 mg  500 mg Oral Q8H PRN Beau Fanny, FNP   500 mg at 08/23/13  1713  . naproxen (NAPROSYN) tablet 500 mg  500 mg Oral BID PRN Beau Fanny, FNP   500 mg at 08/25/13 1453  . nicotine (NICODERM CQ - dosed in mg/24 hours) patch 21 mg  21 mg Transdermal Daily Benjaman Pott, MD   21 mg at 08/25/13 1037  . ondansetron (ZOFRAN-ODT) disintegrating tablet 4 mg  4 mg Oral Q6H PRN Beau Fanny, FNP   4 mg at 08/25/13 1846  . traZODone (DESYREL) tablet 100 mg  100 mg Oral QHS Benjaman Pott, MD   100 mg at 08/24/13 2251    Lab Results: No results found for this or any previous visit (from the past 48 hour(s)).  Physical Findings: AIMS: Facial and Oral Movements Muscles of Facial Expression: None, normal Lips and Perioral Area: None, normal Jaw: None, normal Tongue: None, normal,Extremity Movements Upper (arms, wrists, hands, fingers): None, normal Lower (legs, knees, ankles, toes): None, normal, Trunk Movements Neck, shoulders, hips: None, normal, Overall Severity Severity of abnormal movements (highest score from questions above): None, normal Incapacitation due to abnormal movements: None, normal Patient's awareness of abnormal movements (rate only patient's report): No Awareness, Dental Status Current problems with teeth and/or dentures?: No Does patient usually wear dentures?: No  CIWA:  CIWA-Ar Total: 2 COWS:  COWS Total Score: 6  Treatment Plan Summary: Daily contact with patient to assess and evaluate symptoms and progress in treatment Medication management  Plan: Supportive approach/coping skills/relapse prevention           Decrease the Subutex           Trial with Cymbalta 30 mg            Increase the Neurontin  Medical Decision Making Problem Points:  Review of psycho-social stressors (1) Data Points:  Review of medication regiment & side effects (2) Review of new medications or change in dosage (2)  I certify that inpatient services furnished can reasonably be expected to improve the patient's condition.   Keiosha Cancro  A 08/25/2013, 7:29 PM

## 2013-08-25 NOTE — Progress Notes (Signed)
Adult Psychoeducational Group Note  Date:  08/25/2013 Time:  8:00PM Group Topic/Focus:  Wrap-Up Group:   The focus of this group is to help patients review their daily goal of treatment and discuss progress on daily workbooks.  Participation Level:  Active  Participation Quality:  Appropriate  Affect:  Appropriate  Cognitive:  Appropriate  Insight: Appropriate  Engagement in Group:  Engaged  Modes of Intervention:  Discussion  Additional Comments:  Pt. Attended AA  Bing PlumeScott, Ishia Tenorio D 08/25/2013, 8:16 PM

## 2013-08-25 NOTE — Tx Team (Signed)
Interdisciplinary Treatment Plan Update (Adult)  Date: 08/25/2013  Time Reviewed:  9:45 AM  Progress in Treatment: Attending groups: Yes Participating in groups:  Yes Taking medication as prescribed:  Yes Tolerating medication:  Yes Family/Significant othe contact made: No, pt refused Patient understands diagnosis:  Yes Discussing patient identified problems/goals with staff:  Yes Medical problems stabilized or resolved:  Yes Denies suicidal/homicidal ideation: Yes Issues/concerns per patient self-inventory:  Yes Other:  New problem(s) identified: N/A  Discharge Plan or Barriers: Pt has follow up scheduled at [facility] and [facility] for outpatient therapy and medication management.    Reason for Continuation of Hospitalization: Anxiety Depression Medication Stabilization Detox from Suboxone  Comments: N/A  Estimated length of stay: 3-4 days  For review of initial/current patient goals, please see plan of care.  Attendees: Patient:     Family:     Physician:  Dr. Dub MikesLugo 08/25/2013 10:26 AM   Nursing:   Roswell Minersonna Shimp, RN 08/25/2013 10:26 AM   Clinical Social Worker:  Reyes Ivanhelsea Horton, LCSW 08/25/2013 10:26 AM   Other: Onnie BoerJennifer Clark, RN case manager 08/25/2013 10:26 AM   Other:  Trula SladeHeather Smart, LCSWA 08/25/2013 10:26 AM   Other:  Serena ColonelAggie Nwoko, NP 08/25/2013 10:26 AM   Other:     Other:    Other:    Other:    Other:    Other:    Other:     Scribe for Treatment Team:   Carmina MillerHorton, Vienne Corcoran Nicole, 08/25/2013 , 10:26 AM

## 2013-08-26 MED ORDER — QUETIAPINE FUMARATE 25 MG PO TABS
25.0000 mg | ORAL_TABLET | Freq: Four times a day (QID) | ORAL | Status: DC | PRN
Start: 2013-08-26 — End: 2013-08-30
  Administered 2013-08-27 – 2013-08-29 (×2): 25 mg via ORAL
  Filled 2013-08-26: qty 30
  Filled 2013-08-26 (×2): qty 1

## 2013-08-26 NOTE — Progress Notes (Signed)
[facility]  08/26/2013 9:16 PM Keefer Janyth ContesD Breaker  MRN:  [id number] Subjective: Plumer states that the Cymbalta has made him worst. Admits to feeling more restless, more achy. He is also undergoing weaning off the Subutex. States that eventually he would like to find a Suboxone doctor out there who could help him stay on Suboxone to help manage his pain. Meanwhile he is willing to try other medications that could help manage his pain without opioids Diagnosis:   DSM5: Schizophrenia Disorders:  none Obsessive-Compulsive Disorders:  none Trauma-Stressor Disorders:  none Substance/Addictive Disorders:  Opioid Disorder - Severe (304.00) Depressive Disorders:  Major Depressive Disorder - Moderate (296.22) Total Time spent with patient: 30 minutes  Axis I: Generalized Anxiety Disorder and Substance Induced Mood Disorder  ADL's:  Intact  Sleep: Fair  Appetite:  Fair  Suicidal Ideation:  Plan:  denies Intent:  denies Means:  denies Homicidal Ideation:  Plan:  denies Intent:  denies Means:  denies AEB (as evidenced by):  Psychiatric Specialty Exam: Physical Exam  Review of Systems  Constitutional: Positive for malaise/fatigue.  Eyes: Negative.   Respiratory: Negative.   Cardiovascular: Negative.   Gastrointestinal: Negative.   Genitourinary: Negative.   Musculoskeletal: Positive for back pain, myalgias and neck pain.  Skin: Negative.   Neurological: Negative.   Endo/Heme/Allergies: Negative.   Psychiatric/Behavioral: Positive for depression and substance abuse. The patient is nervous/anxious and has insomnia.     Blood pressure 125/71, pulse 66, temperature 98.7 F (37.1 C), temperature source Oral, resp. rate 18, height 5' 7.72" (1.72 m), weight 69.854 kg (154 lb).Body mass index is 23.61 kg/(m^2).  General Appearance: Fairly Groomed  Patent attorneyye Contact::  Fair  Speech:  Clear and Coherent and rapid  Volume:  Increased  Mood:  Anxious and worried  Affect:  anxious, worried   Thought Process:  Coherent and Goal Directed  Orientation:  Full (Time, Place, and Person)  Thought Content:  symtpoms, worries, concerns  Suicidal Thoughts:  No  Homicidal Thoughts:  No  Memory:  Immediate;   Fair Recent;   Fair Remote;   Fair  Judgement:  Fair  Insight:  Present and Shallow  Psychomotor Activity:  Restlessness  Concentration:  Fair  Recall:  FiservFair  Fund of Knowledge:Fair  Language: Fair  Akathisia:  No  Handed:    AIMS (if indicated):     Assets:  Desire for Improvement  Sleep:  Number of Hours: 5.75   Musculoskeletal: Strength & Muscle Tone: within normal limits Gait & Station: normal Patient leans: N/A  Current Medications: Current Facility-Administered Medications  Medication Dose Route Frequency Provider Last Rate Last Dose  . alum & mag hydroxide-simeth (MAALOX/MYLANTA) 200-200-20 MG/5ML suspension 30 mL  30 mL Oral Q4H PRN Benjaman PottGerald D Taylor, MD   30 mL at 08/26/13 1853  . buprenorphine (SUBUTEX) SL tablet 2 mg  2 mg Sublingual BID Rachael FeeIrving A Clary Boulais, MD   2 mg at 08/26/13 1946  . gabapentin (NEURONTIN) capsule 400 mg  400 mg Oral TID Rachael FeeIrving A Molli Gethers, MD   400 mg at 08/26/13 1722  . gabapentin (NEURONTIN) capsule 400 mg  400 mg Oral QHS Rachael FeeIrving A Lawrie Tunks, MD   400 mg at 08/25/13 2255  . magnesium hydroxide (MILK OF MAGNESIA) suspension 30 mL  30 mL Oral Daily PRN Benjaman PottGerald D Taylor, MD      . nicotine (NICODERM CQ - dosed in mg/24 hours) patch 21 mg  21 mg Transdermal Daily Benjaman PottGerald D Taylor, MD   21  mg at 08/25/13 1037  . QUEtiapine (SEROQUEL) tablet 25 mg  25 mg Oral Q6H PRN Rachael Fee, MD      . traZODone (DESYREL) tablet 100 mg  100 mg Oral QHS Benjaman Pott, MD   100 mg at 08/25/13 2255    Lab Results: No results found for this or any previous visit (from the past 48 hour(s)).  Physical Findings: AIMS: Facial and Oral Movements Muscles of Facial Expression: None, normal Lips and Perioral Area: None, normal Jaw: None, normal Tongue: None, normal,Extremity  Movements Upper (arms, wrists, hands, fingers): None, normal Lower (legs, knees, ankles, toes): None, normal, Trunk Movements Neck, shoulders, hips: None, normal, Overall Severity Severity of abnormal movements (highest score from questions above): None, normal Incapacitation due to abnormal movements: None, normal Patient's awareness of abnormal movements (rate only patient's report): No Awareness, Dental Status Current problems with teeth and/or dentures?: No Does patient usually wear dentures?: No  CIWA:  CIWA-Ar Total: 2 COWS:  COWS Total Score: 3  Treatment Plan Summary: Daily contact with patient to assess and evaluate symptoms and progress in treatment Medication management  Plan: Supportive approach/coping skills/relapse prevention           Wean off the Subutex           Reassess the Cymbalta           Trial with Seroquel 25 mg PRN agitation  Medical Decision Making Problem Points:  Review of psycho-social stressors (1) Data Points:  Review of medication regiment & side effects (2) Review of new medications or change in dosage (2)  I certify that inpatient services furnished can reasonably be expected to improve the patient's condition.   Michaelia Beilfuss A 08/26/2013, 9:16 PM

## 2013-08-26 NOTE — Progress Notes (Signed)
Adult Psychoeducational Group Note  Date:  08/26/2013 Time:  10:00 am  Group Topic/Focus:  Recovery Goals:   The focus of this group is to identify appropriate goals for recovery and establish a plan to achieve them.  Participation Level:  Did Not Attend  Modena NunneryJohnson, Roger Fasnacht 08/26/2013, 1:53 PM

## 2013-08-26 NOTE — [facility] Group Notes (Signed)
[facility] LCSW Group Therapy  08/26/2013   1:15 PM   Type of Therapy:  Group Therapy  Participation Level:  Active  Participation Quality:  Attentive, Sharing and Supportive  Affect:  Depressed and Flat  Cognitive:  Alert and Oriented  Insight:  Developing/Improving and Engaged  Engagement in Therapy:  Developing/Improving and Engaged  Modes of Intervention:  Activity, Clarification, Confrontation, Discussion, Education, Exploration, Limit-setting, Orientation, Problem-solving, Rapport Building, Reality Testing, Socialization and Support  Summary of Progress/Problems: Patient was attentive and engaged with speaker from [employer].  Patient was attentive to speaker while they shared their story of dealing with mental health and overcoming it.  Patient expressed interest in their programs and services and received information on their agency.  Patient processed ways they can relate to the speaker.     Pennelope Basque Horton, LCSW 08/26/2013  1:34 PM     

## 2013-08-26 NOTE — Progress Notes (Signed)
Pt reports he is feeling tired today and thinks it is the medication.  When asked about discharge, pt says he does not know when or where he will go.  He says he has a home to return to and is not sure about rehab.  He is focused on his chronic pain and finding something that works for his pain relief.  He denies SI/HI/AV.  He makes his needs known to staff.  He has not gone to groups today, but he has been out in the milieu socializing when there is not group going on.  Support and encouragement offered.  Safety maintained with q15 minute checks.

## 2013-08-26 NOTE — Progress Notes (Signed)
Recreation Therapy Notes  Animal-Assisted Activity/Therapy (AAA/T) Program Checklist/Progress Notes Patient Eligibility Criteria Checklist & Daily Group note for Rec Tx Intervention  Date: 03.17.2015 Time: 2:45am [name]   AAA/T Program Assumption of Risk Form signed by Patient/ or Parent Legal Guardian yes  Patient is free of allergies or sever asthma yes  Patient reports no fear of animals yes  Patient reports no history of cruelty to animals yes   Patient understands his/her participation is voluntary yes  Behavioral Response: DID NOT ATTEND    Marykay Lexenise L Ashia Dehner, LRT/CTRS  Letta Cargile L 08/26/2013 4:21 PM

## 2013-08-26 NOTE — Progress Notes (Signed)
Pt up to the window for his 8 pm med.  Pt reports he is feeling ok, but is still in pain.  He is concerned about his discharge and does not feel he is ready to go.  He denies SI/HI/AV.  He is having minimal withdrawal symptoms.  He reports his anxiety is high.  Pt says he has been going to groups.  Support and encouragement offered.  Safety maintained with q15 minute checks.

## 2013-08-26 NOTE — Progress Notes (Signed)
D:  Per pt self inventory pt reports sleeping fairly, taking requested sleep medication, appetite improving, energy level low, ability to pay attention improving, rates depression at 6 out of 10 and hopelessness at a 7 out of 10, denies SI/hi/AVH, c/o night sweats, anxiety and chills, and generalized achy pain, pt anxious/depressed during interaction.      A:  Emotional support provided, Encouraged pt to continue with treatment plan and attend all group activities, q15 min checks maintained for safety.  R:  Pt is not going to groups, stayed in bed most of the day today, not feeling well, feels like it is the Cymbalta that has been making him not feel good--pt spoke with MD about this today and MD decided to d/c the Cymbalta.

## 2013-08-26 NOTE — [facility] Group Notes (Signed)
Adult Psychoeducational Group Note  Date:  08/26/2013 Time:  0900am  Group Topic/Focus:  Goals Group:   The focus of this group is to help patients establish daily goals to achieve during treatment and discuss how the patient can incorporate goal setting into their daily lives to aide in recovery. Orientation:   The focus of this group is to educate the patient on the purpose and policies of crisis stabilization and provide a format to answer questions about their admission.  The group details unit policies and expectations of patients while admitted.  Participation Level:  Did Not Attend  Alfonse Sprucehorne, Dyshaun Bonzo Brooke 08/26/2013, 9:59 AM

## 2013-08-27 DIAGNOSIS — F411 Generalized anxiety disorder: Secondary | ICD-10-CM

## 2013-08-27 DIAGNOSIS — F1994 Other psychoactive substance use, unspecified with psychoactive substance-induced mood disorder: Secondary | ICD-10-CM

## 2013-08-27 MED ORDER — IBUPROFEN 200 MG PO TABS
400.0000 mg | ORAL_TABLET | Freq: Four times a day (QID) | ORAL | Status: DC | PRN
  Administered 2013-08-28 – 2013-08-30 (×3): 400 mg via ORAL
  Filled 2013-08-27 (×3): qty 2

## 2013-08-27 NOTE — [facility] Group Notes (Signed)
[facility] LCSW Group Therapy  08/27/2013 3:11 PM  Type of Therapy:  Group Therapy  Participation Level:  Did Not Attend-pt sat in group for less than a minute before leaving. He did not return to group. Pt appears anxious and fidgety.   Smart, Ebrima Ranta LCSWA  08/27/2013, 3:11 PM

## 2013-08-27 NOTE — [facility] Group Notes (Signed)
[facility]   08/27/2013 11:09 AM  Participation Quality:  Appropriate   Mood/Affect:  Appropriate  Depression Rating:  5  Anxiety Rating:  4  Thoughts of Suicide:  Yes Will you contract for safety?   Yes  Current AVH:  No  Plan for Discharge/Comments:  Pt reports that his goal is to be off all meds by d/c. PT stated that he is set up with [facility] and a therapist and plans to return home at d/c (thurs or fri).   Transportation Means: family or bus   Supports: family supports identified.   Smart, American FinancialHeather LCSWA

## 2013-08-27 NOTE — Progress Notes (Signed)
D: Pt presents with flat affect and depressed mood. Pt reports off and on SI. Pt contracts for safety at this time. Pt rates depression 7/10 and hopeless 8/10. Pt verbalized to writer that he plans to stop taking all meds except for neuronin prior to discharge. Pt stated that he do not want to depend on taking pain meds. 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 A: Medications administered as ordered per MD. Verbal support given. Pt encouraged to attend groups. 15 minute checks performed for safety. R: Pt safety maintained at this time.

## 2013-08-27 NOTE — Progress Notes (Signed)
Patient ID: Jared Jared [name], male   DOB: April 04, 1961, 53 y.o.   [name]: [id number] [facility] MD Progress Note  08/27/2013 3:48 PM Jared Jared [name]:  [id number]  Subjective: Jared Jared [name] states that his head in feeling very foggy today. Says he is not thinking clearly. Feels run down. Blamed it on the Effect of Cymbalta. Says he felt sick after lunch, nauseated, did not throw up. Rated his anxiety at #4, and depression #7. No longer on Cymbalta.  Diagnosis:   DSM5: Schizophrenia Disorders:  none Obsessive-Compulsive Disorders:  none Trauma-Stressor Disorders:  none Substance/Addictive Disorders:  Opioid Disorder - Severe (304.00) Depressive Disorders:  Major Depressive Disorder - Moderate (296.22) Total Time spent with patient: 30 minutes  Axis Jared [name]: Generalized Anxiety Disorder and Substance Induced Mood Disorder  ADL's:  Intact  Sleep: Fair  Appetite:  Fair  Suicidal Ideation:  Plan:  denies Intent:  denies Means:  denies Homicidal Ideation:  Plan:  denies Intent:  denies Means:  denies AEB (as evidenced by):  Psychiatric Specialty Exam: Physical Exam  Review of Systems  Constitutional: Positive for malaise/fatigue.  Eyes: Negative.   Respiratory: Negative.   Cardiovascular: Negative.   Gastrointestinal: Negative.   Genitourinary: Negative.   Musculoskeletal: Positive for back pain, myalgias and neck pain.  Skin: Negative.   Neurological: Negative.   Endo/Heme/Allergies: Negative.   Psychiatric/Behavioral: Positive for depression and substance abuse. The patient is nervous/anxious and has insomnia.     Blood pressure 115/76, pulse 66, temperature 97.6 F (36.4 C), temperature source Oral, resp. rate 20, height 5' 7.72" (1.72 m), weight 69.854 kg (154 lb).Body mass index is 23.61 kg/(m^2).  General Appearance: Fairly Groomed  Patent attorney::  Fair  Speech:  Clear and Coherent and rapid  Volume:  Increased  Mood:  Anxious and worried  Affect:  anxious, worried  Thought Process:  Coherent and  Goal Directed  Orientation:  Full (Time, Place, and Person)  Thought Content:  symtpoms, worries, concerns  Suicidal Thoughts:  No  Homicidal Thoughts:  No  Memory:  Immediate;   Fair Recent;   Fair Remote;   Fair  Judgement:  Fair  Insight:  Present and Shallow  Psychomotor Activity:  Restlessness  Concentration:  Fair  Recall:  [employer] of Knowledge:Fair  Language: Fair  Akathisia:  No  Handed:    AIMS (if indicated):     Assets:  Desire for Improvement  Sleep:  Number of Hours: 6.75   Musculoskeletal: Strength & Muscle Tone: within normal limits Gait & Station: normal Patient leans: N/A  Current Medications: Current Facility-Administered Medications  Medication Dose Route Frequency Provider Last Rate Last Dose  . alum & mag hydroxide-simeth (MAALOX/MYLANTA) 200-200-20 MG/5ML suspension 30 mL  30 mL Oral Q4H PRN Benjaman Pott, MD   30 mL at 08/26/13 1853  . buprenorphine (SUBUTEX) SL tablet 2 mg  2 mg Sublingual BID Rachael Fee, MD   2 mg at 08/27/13 0816  . gabapentin (NEURONTIN) capsule 400 mg  400 mg Oral TID Rachael Fee, MD   400 mg at 08/27/13 1200  . gabapentin (NEURONTIN) capsule 400 mg  400 mg Oral QHS Rachael Fee, MD   400 mg at 08/26/13 2136  . magnesium hydroxide (MILK OF MAGNESIA) suspension 30 mL  30 mL Oral Daily PRN Benjaman Pott, MD      . nicotine (NICODERM CQ - dosed in mg/24 hours) patch 21 mg  21 mg Transdermal Daily Benjaman Pott, MD  21 mg at 08/25/13 1037  . QUEtiapine (SEROQUEL) tablet 25 mg  25 mg Oral Q6H PRN Rachael FeeIrving A Lugo, MD   25 mg at 08/27/13 0818  . traZODone (DESYREL) tablet 100 mg  100 mg Oral QHS Benjaman PottGerald D Taylor, MD   100 mg at 08/26/13 2136    Lab Results: No results found for this or any previous visit (from the past 48 hour(s)).  Physical Findings: AIMS: Facial and Oral Movements Muscles of Facial Expression: None, normal Lips and Perioral Area: None, normal Jaw: None, normal Tongue: None, normal,Extremity  Movements Upper (arms, wrists, hands, fingers): None, normal Lower (legs, knees, ankles, toes): None, normal, Trunk Movements Neck, shoulders, hips: None, normal, Overall Severity Severity of abnormal movements (highest score from questions above): None, normal Incapacitation due to abnormal movements: None, normal Patient's awareness of abnormal movements (rate only patient's report): No Awareness, Dental Status Current problems with teeth and/or dentures?: No Does patient usually wear dentures?: No  CIWA:  CIWA-Ar Total: 3 COWS:  COWS Total Score: 4  Treatment Plan Summary: Daily contact with patient to assess and evaluate symptoms and progress in treatment Medication management  Plan: Supportive approach/coping skills/relapse prevention Wean off the Subutex Continue trial with Seroquel 25 mg PRN agitation  Medical Decision Making Problem Points:  Review of psycho-social stressors (1) Data Points:  Review of medication regiment & side effects (2) Review of new medications or change in dosage (2)  Jared [name] certify that inpatient services furnished can reasonably be expected to improve the patient's condition.   Jared Jared [name], Jared Jared [name] 08/27/2013, 3:48 PM

## 2013-08-27 NOTE — Progress Notes (Signed)
Pt observed sitting in the dayroom watching TV and talking with peers.  Pt says he is doing ok this evening.  He still has some anxiety about his medications and wants to be off all of his meds except the Neurontin for his chronic pain.  Pt denies SI/HI/AV.  He says he wants to return home after detox.  Pt makes his needs known to staff. Support and encouragement offered.  Safety maintained with q15 minute checks.

## 2013-08-28 NOTE — Progress Notes (Signed)
0900 morning group  The focus of this group is to educate the patient on the purpose and policies of crisis stabilization and provide a format to answer questions about their admission.  The group details unit policies and expectations of patients while admitted.  Pt did not attend 

## 2013-08-28 NOTE — Progress Notes (Signed)
Recreation Therapy Notes  Animal-Assisted Activity/Therapy (AAA/T) Program Checklist/Progress Notes Patient Eligibility Criteria Checklist & Daily Group note for Rec Tx Intervention  Date: 03.19.2015 Time: 2:45pm [name]   AAA/T Program Assumption of Risk Form signed by Patient/ or Parent Legal Guardian yes  Patient is free of allergies or sever asthma yes  Patient reports no fear of animals yes  Patient reports no history of cruelty to animals yes   Patient understands his/her participation is voluntary yes  Patient washes hands before animal contact yes  Patient washes hands after animal contact yes  Behavioral Response: Appropriate   Education: Hand Washing, Appropriate Animal Interaction   Education Outcome: Acknowledges understanding   Clinical Observations/Feedback: Patient pet therapy dog appropriately and interacted with peers appropriately during session.   Patient was asked to leave session at approximately 3:25pm to meet with NP, patient returned within 5 minutes. Patient transitioned back into group session with incident.   Marykay Lexenise L Corona Popovich, LRT/CTRS  Jearl KlinefelterBlanchfield, Michale Emmerich L 08/28/2013 4:28 PM

## 2013-08-28 NOTE — Progress Notes (Signed)
Pt did not attend AA meeting 

## 2013-08-28 NOTE — Progress Notes (Signed)
D Pt. Denies SI and HI, does have a headache that he received motrin to relieve.  A Writer offered support and encouragement.  R Pt. Remains safe on the unit.  Pt. States he was very depressed in the early am but feels much better now.  Rates his anxiety a 4. Depression a 6.

## 2013-08-28 NOTE — [facility] Group Notes (Signed)
[facility] LCSW Group Therapy  08/28/2013 2:58 PM  Type of Therapy:  Group Therapy  Participation Level:  Active  Participation Quality:  Monopolizing  Affect:  Flat  Cognitive:  Alert, Appropriate and Oriented  Insight:  Developing/Improving  Engagement in Therapy:  Engaged  Modes of Intervention:  Discussion, Exploration, Socialization and Support  Summary of Progress/Problems: Jared [name] was very quiet as group started but soon overtook group discussing his motivation to remain clean using religion as his motivation and trusting God.  He discussed how he found a purpose in God as it relates to staying clean and helping other people.  This made some group members uncomfortable AEB reporting they don't want or feel anything when they help others and God does not always help others.  Jared [name] remained neutral and open to others feelings and concerns, but voices his opinion repeatedly and wanted to monopolize the group instead of let others share differing views.  Jared [name] remained on topic and intention was positive with planning for a successful recovery.  [name], [name] 08/28/2013, 2:58 PM

## 2013-08-28 NOTE — Progress Notes (Signed)
Patient ID: Jared [name], male   DOB: 12/14/1960, 53 y.o.   MRN: [id number] Patient ID: Jared [name], male   DOB: 01/26/1961, 53 y.o.   MRN: [id number] [facility]  08/28/2013 4:58 PM Jared [name]  MRN:  [id number]  Subjective: Jared [name] reports that he is feeling much better today. Says his head feels clearer. Plan on staying on Neurontin for his chronic pain. Denies any SIHI, AVH.  Diagnosis:   DSM5: Schizophrenia Disorders:  none Obsessive-Compulsive Disorders:  none Trauma-Stressor Disorders:  none Substance/Addictive Disorders:  Opioid Disorder - Severe (304.00) Depressive Disorders:  Major Depressive Disorder - Moderate (296.22) Total Time spent with patient: 30 minutes  Axis I: Generalized Anxiety Disorder and Substance Induced Mood Disorder  ADL's:  Intact  Sleep: Fair  Appetite:  Fair  Suicidal Ideation:  Plan:  denies Intent:  denies Means:  denies Homicidal Ideation:  Plan:  denies Intent:  denies Means:  denies AEB (as evidenced by):  Psychiatric Specialty Exam: Physical Exam  Review of Systems  Constitutional: Positive for malaise/fatigue.  Eyes: Negative.   Respiratory: Negative.   Cardiovascular: Negative.   Gastrointestinal: Negative.   Genitourinary: Negative.   Musculoskeletal: Positive for back pain, myalgias and neck pain.  Skin: Negative.   Neurological: Negative.   Endo/Heme/Allergies: Negative.   Psychiatric/Behavioral: Positive for depression and substance abuse. The patient is nervous/anxious and has insomnia.     Blood pressure 122/77, pulse 70, temperature 98 F (36.7 C), temperature source Oral, resp. rate 16, height 5' 7.72" (1.72 m), weight 69.854 kg (154 lb).Body mass index is 23.61 kg/(m^2).  General Appearance: Fairly Groomed  Patent attorneyye Contact::  Fair  Speech:  Clear and Coherent and rapid  Volume:  Increased  Mood:  Anxious and worried  Affect:  anxious, worried  Thought Process:  Coherent and Goal Directed  Orientation:  Full (Time,  Place, and Person)  Thought Content:  symtpoms, worries, concerns  Suicidal Thoughts:  No  Homicidal Thoughts:  No  Memory:  Immediate;   Fair Recent;   Fair Remote;   Fair  Judgement:  Fair  Insight:  Present and Shallow  Psychomotor Activity:  Restlessness  Concentration:  Fair  Recall:  [employer]Fair  Fund of Knowledge:Fair  Language: Fair  Akathisia:  No  Handed:    AIMS (if indicated):     Assets:  Desire for Improvement  Sleep:  Number of Hours: 5.75   Musculoskeletal: Strength & Muscle Tone: within normal limits Gait & Station: normal Patient leans: N/A  Current Medications: Current Facility-Administered Medications  Medication Dose Route Frequency Provider Last Rate Last Dose  . alum & mag hydroxide-simeth (MAALOX/MYLANTA) 200-200-20 MG/5ML suspension 30 mL  30 mL Oral Q4H PRN Benjaman PottGerald D Taylor, MD   30 mL at 08/26/13 1853  . buprenorphine (SUBUTEX) SL tablet 2 mg  2 mg Sublingual BID Rachael FeeIrving A Lugo, MD   2 mg at 08/28/13 [id number]  . gabapentin (NEURONTIN) capsule 400 mg  400 mg Oral TID Rachael FeeIrving A Lugo, MD   400 mg at 08/28/13 1601  . gabapentin (NEURONTIN) capsule 400 mg  400 mg Oral QHS Rachael FeeIrving A Lugo, MD   400 mg at 08/27/13 2307  . ibuprofen (ADVIL,MOTRIN) tablet 400 mg  400 mg Oral Q6H PRN Sanjuana KavaAgnes I Nwoko, NP   400 mg at 08/28/13 1601  . magnesium hydroxide (MILK OF MAGNESIA) suspension 30 mL  30 mL Oral Daily PRN Benjaman PottGerald D Taylor, MD      . nicotine (NICODERM CQ -  dosed in mg/24 hours) patch 21 mg  21 mg Transdermal Daily Benjaman Pott, MD   21 mg at 08/25/13 1037  . QUEtiapine (SEROQUEL) tablet 25 mg  25 mg Oral Q6H PRN Rachael Fee, MD   25 mg at 08/27/13 0818  . traZODone (DESYREL) tablet 100 mg  100 mg Oral QHS Benjaman Pott, MD   100 mg at 08/27/13 2306    Lab Results: No results found for this or any previous visit (from the past 48 hour(s)).  Physical Findings: AIMS: Facial and Oral Movements Muscles of Facial Expression: None, normal Lips and Perioral Area: None,  normal Jaw: None, normal Tongue: None, normal,Extremity Movements Upper (arms, wrists, hands, fingers): None, normal Lower (legs, knees, ankles, toes): None, normal, Trunk Movements Neck, shoulders, hips: None, normal, Overall Severity Severity of abnormal movements (highest score from questions above): None, normal Incapacitation due to abnormal movements: None, normal Patient's awareness of abnormal movements (rate only patient's report): No Awareness, Dental Status Current problems with teeth and/or dentures?: No Does patient usually wear dentures?: No  CIWA:  CIWA-Ar Total: 1 COWS:  COWS Total Score: 4  Treatment Plan Summary: Daily contact with patient to assess and evaluate symptoms and progress in treatment Medication management  Plan: Supportive approach/coping skills/relapse prevention Wean off the Subutex Conrinue Seroquel 25 mg PRN agitation.  Medical Decision Making Problem Points:  Review of psycho-social stressors (1) Data Points:  Review of medication regiment & side effects (2) Review of new medications or change in dosage (2)  I certify that inpatient services furnished can reasonably be expected to improve the patient's condition.   Armandina Stammer I, Pmhnp-bc 08/28/2013, 4:58 PM

## 2013-08-29 DIAGNOSIS — F1994 Other psychoactive substance use, unspecified with psychoactive substance-induced mood disorder: Secondary | ICD-10-CM

## 2013-08-29 DIAGNOSIS — F411 Generalized anxiety disorder: Secondary | ICD-10-CM

## 2013-08-29 NOTE — Progress Notes (Signed)
Patient ID: Jared [name], male   DOB: 04/25/1961, 53 y.o.   MRN: [id number] [facility]  08/29/2013 2:45 PM Jared [name]  MRN:  [id number]  Subjective:  Patient states "I don't want to be back on narcotics. I will stay away from my friend who uses heroine. I don't want to end up dead from that. I want to be a role model for young people."  Objective:  Patient is active on the unit and attending the scheduled groups. Rates his depression at three. Patient appears motivated to stop his opiate abuse. He appears to have insight into the potential negative consequences. Nursing staff report that the patient threw up today after lunch. Jared [name] reports having some nausea since he was started on Cymbalta earlier in the week, which caused it to be discontinued. Jared [name] denies fever, chills, diarrhea, abdominal pain, chills or any symptoms of feeling ill. He informed Clinical research associatewriter that he would not be in group if he was not feeling well physically. Patient appears to be in no acute distress. He reports only one episode of vomiting today after lunch stating "I'm not used to eating so frequent and often. Normally I only eat two meals per day." Jared [name] is currently afebrile with stable vital signs.   Diagnosis:   DSM5: Schizophrenia Disorders:  none Obsessive-Compulsive Disorders:  none Trauma-Stressor Disorders:  none Substance/Addictive Disorders:  Opioid Disorder - Severe (304.00) Depressive Disorders:  Major Depressive Disorder - Moderate (296.22) Total Time spent with patient: 20 minutes  Axis I: Generalized Anxiety Disorder and Substance Induced Mood Disorder  ADL's:  Intact  Sleep: Fair  Appetite:  Fair  Suicidal Ideation:  Plan:  denies Intent:  denies Means:  denies Homicidal Ideation:  Plan:  denies Intent:  denies Means:  denies AEB (as evidenced by):  Psychiatric Specialty Exam: Physical Exam  Review of Systems  Constitutional: Negative.  Negative for fever, chills, weight loss,  malaise/fatigue and diaphoresis.  HENT: Negative.   Eyes: Negative.   Respiratory: Negative.   Cardiovascular: Negative.   Gastrointestinal: Positive for nausea and vomiting. Negative for heartburn, abdominal pain, diarrhea, constipation, blood in stool and melena.  Genitourinary: Negative.  Negative for dysuria, urgency, frequency, hematuria and flank pain.  Musculoskeletal: Positive for back pain and neck pain.  Skin: Negative.   Neurological: Negative.  Negative for weakness.  Endo/Heme/Allergies: Negative.   Psychiatric/Behavioral: Positive for depression and substance abuse. Negative for suicidal ideas, hallucinations and memory loss. The patient is nervous/anxious and has insomnia.     Blood pressure 115/80, pulse 69, temperature 97.8 F (36.6 C), temperature source Oral, resp. rate 16, height 5' 7.72" (1.72 m), weight 69.854 kg (154 lb).Body mass index is 23.61 kg/(m^2).  General Appearance: Fairly Groomed  Patent attorneyye Contact::  Fair  Speech:  Clear and Coherent and rapid  Volume:  Increased  Mood:  Anxious and worried  Affect:  anxious, worried  Thought Process:  Coherent and Goal Directed  Orientation:  Full (Time, Place, and Person)  Thought Content:  symtpoms, worries, concerns  Suicidal Thoughts:  No  Homicidal Thoughts:  No  Memory:  Immediate;   Fair Recent;   Fair Remote;   Fair  Judgement:  Fair  Insight:  Present and Shallow  Psychomotor Activity:  Restlessness  Concentration:  Fair  Recall:  [employer]Fair  Fund of Knowledge:Fair  Language: Fair  Akathisia:  No  Handed:    AIMS (if indicated):     Assets:  Desire for Improvement  Sleep:  Number  of Hours: 5.25   Musculoskeletal: Strength & Muscle Tone: within normal limits Gait & Station: normal Patient leans: N/A  Current Medications: Current Facility-Administered Medications  Medication Dose Route Frequency Provider Last Rate Last Dose  . alum & mag hydroxide-simeth (MAALOX/MYLANTA) 200-200-20 MG/5ML suspension 30  mL  30 mL Oral Q4H PRN Benjaman Pott, MD   30 mL at 08/26/13 1853  . buprenorphine (SUBUTEX) SL tablet 2 mg  2 mg Sublingual BID Rachael Fee, MD   2 mg at 08/29/13 6045  . gabapentin (NEURONTIN) capsule 400 mg  400 mg Oral TID Rachael Fee, MD   400 mg at 08/29/13 1200  . gabapentin (NEURONTIN) capsule 400 mg  400 mg Oral QHS Rachael Fee, MD   400 mg at 08/28/13 2142  . ibuprofen (ADVIL,MOTRIN) tablet 400 mg  400 mg Oral Q6H PRN Sanjuana Kava, NP   400 mg at 08/29/13 4098  . magnesium hydroxide (MILK OF MAGNESIA) suspension 30 mL  30 mL Oral Daily PRN Benjaman Pott, MD      . nicotine (NICODERM CQ - dosed in mg/24 hours) patch 21 mg  21 mg Transdermal Daily Benjaman Pott, MD   21 mg at 08/29/13 0800  . QUEtiapine (SEROQUEL) tablet 25 mg  25 mg Oral Q6H PRN Rachael Fee, MD   25 mg at 08/27/13 0818  . traZODone (DESYREL) tablet 100 mg  100 mg Oral QHS Benjaman Pott, MD   100 mg at 08/28/13 2246    Lab Results: No results found for this or any previous visit (from the past 48 hour(s)).  Physical Findings: AIMS: Facial and Oral Movements Muscles of Facial Expression: None, normal Lips and Perioral Area: None, normal Jaw: None, normal Tongue: None, normal,Extremity Movements Upper (arms, wrists, hands, fingers): None, normal Lower (legs, knees, ankles, toes): None, normal, Trunk Movements Neck, shoulders, hips: None, normal, Overall Severity Severity of abnormal movements (highest score from questions above): None, normal Incapacitation due to abnormal movements: None, normal Patient's awareness of abnormal movements (rate only patient's report): No Awareness, Dental Status Current problems with teeth and/or dentures?: No Does patient usually wear dentures?: No  CIWA:  CIWA-Ar Total: 0 COWS:  COWS Total Score: 4  Treatment Plan Summary: Daily contact with patient to assess and evaluate symptoms and progress in treatment Medication management  Plan:  1. Continue crisis  management and stabilization.  2. Medication management: Continue Seroquel 25 mg every six hours prn anxiety, Neurontin 400 mg TIS & hs for improved mood stability, Subutex 2 mg BID for opioid dependence in the process of being tapered gradually.  3. Encouraged patient to attend groups and participate in group counseling sessions and activities.  4. Discharge plan in progress. Will follow up [facility] for outpatient therapy.  5. Continue current treatment plan.  6. Anticipate d/c home tomorrow.   Medical Decision Making Problem Points:  Established problem, stable/improving (1), Review of last therapy session (1) and Review of psycho-social stressors (1) Data Points:  Review of medication regiment & side effects (2) Review of new medications or change in dosage (2)  I certify that inpatient services furnished can reasonably be expected to improve the patient's condition.   Fransisca Kaufmann, NP-C 08/29/2013, 2:45 PM Patient seen, evaluated and I agree with notes by Nurse Practitioner. Thedore Mins, MD

## 2013-08-29 NOTE — [facility] Group Notes (Signed)
[facility]   08/29/2013 9:41 AM  Participation Quality:  Attentive and Supportive of other members  Mood/Affect:  Flat  Depression Rating:  4  Anxiety Rating:  4  Thoughts of Suicide:  No Will you contract for safety?   Yes  Current AVH:  No  Plan for Discharge/Comments:  Yash reports he does not feel ready for DC today, reporting many nightmares from the previous night, poor sleep, feeling anxious about his DC plan along with homeless. There is no real evidence to support homelessness as he has a home, but fears the worst as he owes many people different thing.  [facility] and with his private therapist whom he reports is good support and helps him tremendously.    Esmond is more depressed today with low motor functions and slow speech. Reports he is not ready for DC and mood depressed with congruent affect. Reports no SI, HI of AVH at this time.  Transportation Means: friends or the bus  Supports:  Church family.  Raye Sorrowoble, Moxie Kalil N

## 2013-08-29 NOTE — B[facility] Group Notes (Signed)
[facility] LCSW Group Therapy  08/29/2013 2:45 PM  Type of Therapy:  Group Therapy  Participation Level:  Minimal  Participation Quality:  Intrusive  Affect:  Blunted  Cognitive:  Alert and Oriented  Insight:  Limited  Engagement in Therapy:  Lacking  Modes of Intervention:  Discussion, Exploration, Problem-solving and Support  Summary of Progress/Problems: Group discussion centered around the topic of recovery with patient's processing feelings associated with self-sabatoge feelings of failure, and improving outcomes.   Layn was present for only part of group and did not share specific feelings around relapse. He shares it would be important to change his environment and have increased supports that are wanting him to remain free from substances.  Dohn remains blunted and listened when others talked, would go on tangents about his past history of use and limited insight on remaining in recovery. [name], Aamori Mcmasters N 08/29/2013, 2:45 PM

## 2013-08-29 NOTE — Tx Team (Signed)
Interdisciplinary Treatment Plan Update (Adult)  Date: 08/29/2013  Time Reviewed:  9:45 AM  Progress in Treatment: Attending groups: Yes Participating in groups:  Yes Taking medication as prescribed:  Yes Tolerating medication:  Yes Family/Significant othe contact made: No, pt refused Patient understands diagnosis:  Yes Discussing patient identified problems/goals with staff:  Yes Medical problems stabilized or resolved:  Yes Denies suicidal/homicidal ideation: Yes Issues/concerns per patient self-inventory:  Yes Other:  New problem(s) identified: N/A  Discharge Plan or Barriers: Pt [facility] and medication management.    Reason for Continuation of Hospitalization: Medication Stabilization Detox from Suboxone  Comments: N/A  Estimated length of stay: DC on Saturday  For review of initial/current patient goals, please see plan of care.  Attendees: Patient:     Family:     Physician:  Dr. Dub MikesLugo 08/29/2013 11:49 AM   Nursing:   Roswell Minersonna Shimp, RN 08/29/2013 11:49 AM   Clinical Social Worker:  Reyes Ivanhelsea Horton, LCSW 08/29/2013 11:49 AM   Other: Onnie BoerJennifer Clark, RN case manager 08/29/2013 11:49 AM   Other:  Trula SladeHeather Smart, LCSWA 08/29/2013 11:49 AM   Other:  Serena ColonelAggie Nwoko, NP 08/29/2013 11:49 AM   Other:  Mordecai RasmussenHannah Wyman Meschke, LCSW   Other:    Other:    Other:    Other:    Other:    Other:     Scribe for Treatment Team:   Carmina MillerHorton, Chelsea Nicole, 08/29/2013 , 11:49 AM

## 2013-08-29 NOTE — Progress Notes (Signed)
Adult Psychoeducational Group Note  Date:  08/29/2013 Time:  10:00AM  Group Topic/Focus:  Relapse Prevention Planning:   The focus of this group is to define relapse and discuss the need for planning to combat relapse.  Participation Level:  Active  Participation Quality:  Appropriate and Attentive  Affect:  Appropriate  Cognitive:  Appropriate  Insight: Appropriate  Engagement in Group:  Engaged  Modes of Intervention:  Discussion  Additional Comments:  Pt was asked to describe their motivation to remain clean and share something awesome about themselves. 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   Zacarias PontesSmith, Haidar Muse R 08/29/2013, 10:52 AM

## 2013-08-29 NOTE — Progress Notes (Signed)
[facility] :  Will you be returning to the same living situation after discharge: Yes,  returning to his house At discharge, do you have transportation home?:Yes,  bus pass or friend can come and pick him up Do you have the ability to pay for your medications:Yes,  no barriers  Release of information consent forms completed and in the chart;  Patient's signature needed at discharge.  Patient to Follow up at: Follow-up Information   Follow up with [facility] On 08/29/2013. (Appointment scheduled at 2:00 pm on this date for therapy.  )    Contact information:    [address] [place], [place] [address] Phone: [phone] No Fax number      Follow up with [facility] On 09/01/2013. (Walk in on for a hospital discharge appointment. Walk in clinic is Monday - Friday 8 am - 3 pm. They will than schedule you for medication mangement and therapy. )    Contact information:   [address] [place], [place] [address] Phone: [phone] Fax: [phone]      Patient denies SI/HI:   Yes,  no reports    Safety Planning and Suicide Prevention discussed:  Yes,  see SI note  Patient to DC on Saturday if stable.  Patient was to DC on Friday, but due to poor sleep and nightmares, held for observation one additional day.  Has follow up in place.  Jared [name], Hannah N 08/29/2013, 4:03 PM

## 2013-08-29 NOTE — Progress Notes (Signed)
D  Pt in bed resting with eyes closed  No distress noted A   Continue to monitor Q 15 min R   Pt safe at present

## 2013-08-30 DIAGNOSIS — F1120 Opioid dependence, uncomplicated: Secondary | ICD-10-CM

## 2013-08-30 DIAGNOSIS — F331 Major depressive disorder, recurrent, moderate: Principal | ICD-10-CM

## 2013-08-30 MED ORDER — TRAZODONE HCL 100 MG PO TABS: 100.0000 mg | ORAL_TABLET | Freq: Every day | ORAL | Status: AC

## 2013-08-30 MED ORDER — GABAPENTIN 400 MG PO CAPS: ORAL_CAPSULE | ORAL | Status: AC

## 2013-08-30 MED ORDER — NICOTINE 21 MG/24HR TD PT24: 21.0000 mg | MEDICATED_PATCH | Freq: Every day | TRANSDERMAL | Status: AC

## 2013-08-30 MED ORDER — QUETIAPINE FUMARATE 25 MG PO TABS: 25.0000 mg | ORAL_TABLET | Freq: Four times a day (QID) | ORAL | Status: AC | PRN

## 2013-08-30 MED ORDER — ACETAMINOPHEN 500 MG PO TABS: 500.0000 mg | ORAL_TABLET | Freq: Four times a day (QID) | ORAL | Status: AC | PRN

## 2013-08-30 NOTE — Progress Notes (Signed)
D: pt is minimal on contact. Pt stated he is feeling depressed and hopeless. Pt has a flat affect, depressed mood. Pt stated he still doesn't feel ready to leave. Passive si, pt contracts for safety. denies hi/avh. Denies pain and withdrawal symptoms  A: support and encouragement offered. q 15 min safety checks. scheduled medications given R: pt remains safe on unit. No further complaints or signs of distress at this time

## 2013-08-30 NOTE — Progress Notes (Signed)
Adult Psychoeducational Group Note  Date:  08/30/2013 Time:  1315  Group Topic/Focus:  Relapse Prevention Planning:   The focus of this group is to define relapse and discuss the need for planning to combat relapse.  Participation Level:  Active  Participation Quality:  Attentive, Intrusive, Sharing and Supportive  Affect:  Appropriate and Excited  Cognitive:  Alert, Appropriate and Oriented  Insight: Good and Improving  Engagement in Group:  Improving  Modes of Intervention:  Discussion and Education  Additional Comments:  Pt is enthusiastic during the discussion. Pt insight seems to be improving as well.  Millie Shorb Shari Prowsvan 08/30/2013, 2:55 PM

## 2013-08-30 NOTE — Progress Notes (Signed)
Patient ID: Jared [name], male   DOB: 11/24/1960, [age] y.o.   MRN: [id number] Writer reviewed pt discharge instructions with pt including medications, follow up care and crisis intervention. Pt acknowledged understanding of instructions and states that [facility]. Pt denies SI/HI and AVH. Pt mood and affect are appropriate to the situation. Writer returned pt belongings from locker, and the pt is released into his own care.

## 2013-08-30 NOTE — Progress Notes (Signed)
Patient ID: Jared [name], male   DOB: 10/17/1960, 53 y.o.   MRN: [id number] Patient give two letters stating dates of hospitalization per pt's request. Carney Bernatherine C Alexsis Kathman, LCSW

## 2013-08-30 NOTE — [facility] Suicide Risk Assessment (Signed)
Demographic Factors:  Male, Divorced or widowed, Caucasian and Low socioeconomic status  Total Time spent with patient: Greater than 30 minutes  Psychiatric Specialty Exam: Physical Exam  Vitals reviewed. Constitutional: He appears well-developed and well-nourished. No distress.  Skin: He is not diaphoretic.  Musculoskeletal: Strength & Muscle Tone: within normal limits Gait & Station: normal Patient leans: N/A Psychiatric: His speech is normal and behavior is normal. Judgment and thought content normal. His mood appears not anxious. His affect is not angry, not blunt, not labile and not inappropriate. Cognition and memory are normal. He does not exhibit a depressed mood.   Review of Systems  Constitutional: Negative for fever, chills, weight loss and malaise/fatigue.  Cardiovascular: Negative for chest pain, palpitations and leg swelling.  Gastrointestinal: Negative for heartburn, nausea, vomiting, abdominal pain and diarrhea.  Skin: Negative for itching and rash.  Neurological: Negative for dizziness, tingling, tremors and sensory change.     Blood pressure 111/68, pulse 54, temperature 97.9 F (36.6 C), temperature source Oral, resp. rate 16, height 5' 7.72" (1.72 m), weight 69.854 kg (154 lb).Body mass index is 23.61 kg/(m^2).   General Appearance: Casual and Fairly Groomed   Patent attorney:: Good   Speech: Clear and Coherent   Volume: Normal   Mood: Stable   Affect: Appropriate and Congruent   Thought Process: Coherent and Goal Directed   Orientation: Full (Time, Place, and Person)   Thought Content: Denies any psychotic symptoms   Suicidal Thoughts: No   Homicidal Thoughts: No   Memory: Immediate; Good  Recent; Good  Remote; Good   Judgement: Intact   Insight: Present   Psychomotor Activity: Normal   Concentration: Good   Recall: Good   Fund of Knowledge:Fair   Language: Good   Akathisia: No   Handed: Right   AIMS (if indicated):   Assets: Communication Skills   Desire for Improvement  Physical Health   Sleep: Number of Hours: 4.75       Mental Status Per Nursing Assessment::   On Admission:  Suicidal ideation indicated by patient;Self-harm thoughts  Current Mental Status by Physician: NA  Loss Factors: NA  Historical Factors: Substance Abuse  Risk Reduction Factors:   Religious beliefs about death and Positive social support  Continued Clinical Symptoms:  Alcohol/Substance Abuse/Dependencies  Cognitive Features That Contribute To Risk:  Polarized thinking Thought constriction (tunnel vision)    Suicide Risk:  Minimal: No identifiable suicidal ideation.  Patients presenting with no risk factors but with morbid ruminations; may be classified as minimal risk based on the severity of the depressive symptoms  Discharge Diagnoses:   Axis Diagnosis:  AXIS I: Opioid dependence, Major depressive disorder, recurrent episode, moderate  AXIS II: Deferred  AXIS III:  Past Medical History   Diagnosis  Date   .  Chronic pain    .  Polysubstance abuse    .  Back pain    .  Depression    .  Alcoholism    .  Anxiety    .  Migraine    .  Arthritis      DDD   .  Drug abuse  08/13/2012    AXIS IV: Opioid dependence, chronic  AXIS V: 62  Plan Of Care/Follow-up recommendations:  Activity:  Increase as tolerated Diet:  As recommended by your PCP. Tests:  None Other:  Medications as listed below. Medication List       Indication    acetaminophen 500 MG tablet  Indication: Pain  Commonly known as: TYLENOL     Take 1 tablet (500 mg total) by mouth every 6 (six) hours as needed for mild pain.     gabapentin 400 MG capsule  Indication: Pain, Substance withdrawal syndrome    Commonly known as: NEURONTIN     Take 1 tablet (400 mg) three times daily and 1 tablet (400 mg) at bedtime: For substance withdrawal syndrome/Pain     nicotine 21 mg/24hr patch  Indication: Nicotine Addiction    Commonly known as: NICODERM CQ - dosed in mg/24  hours     Place 1 patch (21 mg total) onto the skin daily. For Nicotine dependence     QUEtiapine 25 MG tablet  Indication: Anxiety/mood control    Commonly known as: SEROQUEL     Take 1 tablet (25 mg total) by mouth every 6 (six) hours as needed (anxiety).     traZODone 100 MG tablet  Indication: Trouble Sleeping    Commonly known as: DESYREL     Take 1 tablet (100 mg total) by mouth at bedtime. For sleep       Follow-up Information    Follow up with [facility] On 08/29/2013. (Appointment scheduled at 2:00 pm on this date for therapy. )    Contact information:    [address]  [place], [place] [address]  Phone: [phone]  No Fax number       Follow up with [facility] On 09/01/2013. (Walk in on for a hospital discharge appointment. Walk in clinic is Monday - Friday 8 am - 3 pm. They will than schedule you for medication mangement and therapy. )    Contact information:    [address]  [place], [place] [address]  Phone: [phone]  Fax: [phone]    Is patient on multiple antipsychotic therapies at discharge:  No   Has Patient had three or more failed trials of antipsychotic monotherapy by history:  No  Recommended Plan for Multiple Antipsychotic Therapies: NA  Jenaveve Fenstermaker 08/30/2013, 10:50 AM

## 2013-08-30 NOTE — [facility] Group Notes (Signed)
[facility] Group Notes:  (Nursing/MHT/Case Management/Adjunct)  Date:  08/30/2013  Time:  10:34 AM  Type of Therapy:  Psychoeducational Skills  Participation Level:  Active  Participation Quality:  Appropriate  Affect:  Appropriate  Cognitive:  Appropriate  Insight:  Appropriate  Engagement in Group:  Engaged  Modes of Intervention:  Discussion  Summary of Progress/Problems: Pt did attend self inventory group, pt reported that he was negative SI/HI, no AH/VH noted. Pt rated his depression as a 3, and his helplessness/hopelessness as a 3.  Pt reported that he was going to be discharged today, and that he was ready.    Jacquelyne BalintForrest, [name] Shanta 08/30/2013, 10:34 AM

## 2013-08-30 NOTE — Progress Notes (Signed)
[facility] Group Notes:  (Nursing/MHT/Case Management/Adjunct)  Date:  08/30/2013  Time:  3:15PM  Type of Therapy:  Psychoeducational Skills  Participation Level:  Active  Participation Quality:  Appropriate and Attentive  Affect:  Appropriate  Cognitive:  Appropriate  Insight:  Appropriate  Engagement in Group:  Engaged and Supportive  Modes of Intervention:  Activity  Summary of Progress/Problems: Pts played a game of Pictionary using different coping skills. Pts were asked one coping skill they have learned while here at the hospital. Pt identified a coping skill of praying.  Jared [name], Jared [name] 08/30/2013, 6:50 PM
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
## 2013-08-30 NOTE — [facility] Group Notes (Signed)
[facility] LCSW Group Therapy  08/30/2013 10:00AM  Type of Therapy:  Group Therapy  Participation Level:  Active  Participation Quality:  Monopolizing  Affect:  Depressed  Cognitive:  Alert and Oriented  Insight:  Limited  Engagement in Therapy:  Limited  Modes of Intervention:  Clarification, Discussion, Exploration, Rapport Building, Socialization and Support  Summary of Progress/Problems: The main focus of today's process group was for the patient to identify ways in which they have in the past sabotaged their own recovery. Motivational Interviewing was utilized to ask the group members what they get out of their substance use, and what reasons they may have for wanting to change. The Stages of Change were explained using a handout, and patients identified where they currently are with regard to stages of change.  Jared [name] was insistent that he does not need to be involved in change as "my chronic pain issues prevent me from change." When asked about alternative therapies for dealing with chronic pain patient was resistant to seeing any hope. Patient reports that my only hope is "to avoid the dope man."  Patient not open to role playing contact with the dope man.  Jared [name], Jared [name]

## 2013-09-04 NOTE — Progress Notes (Addendum)
Patient Discharge Instructions:  After Visit Summary (AVS):   Faxed to:  09/04/13 Discharge Summary Note:   Faxed to:  09/04/13 Psychiatric Admission Assessment Note:   Faxed to:  09/04/13 Suicide Risk Assessment - Discharge Assessment:   Faxed to:  09/04/13 Faxed/Sent to the Next Level Care provider:  09/04/13 [facility] to [facility] @ [phone] No documentation was faxed to [facility].  Jerelene ReddenSheena E [place], 09/04/2013, 3:14 PM

## 2013-10-10 DEATH — deceased
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
